# Patient Record
Sex: Female | Born: 2000 | Race: White | Hispanic: No | Marital: Single | Smoking: Never smoker
Health system: Southern US, Community
[De-identification: ages and names within clinical notes are randomized; demographics above are authoritative.]

## PROBLEM LIST (undated history)

## (undated) DIAGNOSIS — R9431 Abnormal electrocardiogram [ECG] [EKG]: Secondary | ICD-10-CM

## (undated) DIAGNOSIS — R000 Tachycardia, unspecified: Secondary | ICD-10-CM

## (undated) DIAGNOSIS — J029 Acute pharyngitis, unspecified: Secondary | ICD-10-CM

---
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
## 2020-01-09 ENCOUNTER — Other Ambulatory Visit: Payer: Self-pay | Admitting: Family Medicine

## 2020-01-14 ENCOUNTER — Telehealth: Payer: Self-pay | Admitting: Family Medicine

## 2020-01-14 NOTE — Telephone Encounter (Signed)
Patient's mother called today to schedule visit for sore throat and clamminess.  Patient's mother was advised to take patient to an Urgent care for eval.  Advised that if strep test is needed that could be done while at [facility] and they can also look into her throat. Mom did not state if she was going to take patient to urgent care.  Spoke with Artelia Laroche and she was going to reach out to patient for further questions

## 2020-01-14 NOTE — Telephone Encounter (Addendum)
I spoke with pt and she said she does not have a fever but she is feeling clammy (not diabetic) and she has felt that way before but cannot remember when or circumstance. Pt said she did not feel like she was going to pass out;no swelling in throat and no difficulty breathing. Pt said the throat is not scratchy it is really sore to swallow. I advised that there are no appts at [facility] today and that I did not think a virtual appt would be as helpful as a face to face visit since pt may need strep test. Pts mom spoke up and said why can pt not come in office and I explained why and she voiced understanding and I also advised that if pt has strep throat and not treated could cause other health issues. Again pts mom who was on speaker phone with pt and voiced understanding. I was not given definite confirmation that pt would go to UC but pts mom thanked me and hung up.

## 2020-01-14 NOTE — Telephone Encounter (Signed)
Agree with advisement to seek face to face care for strep test Thanks

## 2020-01-15 DIAGNOSIS — R0789 Other chest pain: Secondary | ICD-10-CM | POA: Diagnosis not present

## 2020-01-15 DIAGNOSIS — R05 Cough: Secondary | ICD-10-CM | POA: Diagnosis not present

## 2020-01-15 DIAGNOSIS — J029 Acute pharyngitis, unspecified: Secondary | ICD-10-CM | POA: Diagnosis not present

## 2020-01-15 DIAGNOSIS — J069 Acute upper respiratory infection, unspecified: Secondary | ICD-10-CM | POA: Diagnosis not present

## 2020-01-15 DIAGNOSIS — Z20822 Contact with and (suspected) exposure to covid-19: Secondary | ICD-10-CM | POA: Diagnosis not present

## 2020-02-10 ENCOUNTER — Ambulatory Visit (INDEPENDENT_AMBULATORY_CARE_PROVIDER_SITE_OTHER): Payer: BC Managed Care – PPO | Admitting: Family Medicine

## 2020-02-10 ENCOUNTER — Encounter: Payer: Self-pay | Admitting: Family Medicine

## 2020-02-10 ENCOUNTER — Other Ambulatory Visit: Payer: Self-pay

## 2020-02-10 VITALS — BP 102/70 | HR 84 | Temp 97.6°F | Ht 60.33 in | Wt 102.0 lb

## 2020-02-10 DIAGNOSIS — Z0000 Encounter for general adult medical examination without abnormal findings: Secondary | ICD-10-CM | POA: Diagnosis not present

## 2020-02-10 NOTE — Progress Notes (Signed)
Subjective:    Patient ID: Angelina Sheriff, female    DOB: [birth year], 19 y.o.   MRN: [id number]  This visit occurred during the SARS-CoV-2 public health emergency.  Safety protocols were in place, including screening questions prior to the visit, additional usage of staff PPE, and extensive cleaning of exam room while observing appropriate contact time as indicated for disinfecting solutions.    HPI Here for health maintenance exam and to review chronic medical problems    Wt Readings from Last 3 Encounters:  02/10/20 102 lb (46.3 kg) (6 %, Z= -1.60)*  11/23/19 107 lb 8 oz (48.8 kg) (13 %, Z= -1.13)*  01/14/19 104 lb 12 oz (47.5 kg) (11 %, Z= -1.23)*   * Growth percentiles are based on CDC (Girls, 2-20 Years) data.   19.70 kg/m (25 %, Z= -0.68, Source: CDC (Girls, 2-20 Years))  She is eating well - even with long hours  Work is a lot of exercise  She does a [employer] every week   In college this year  Only 2 in person classes-mostly on line  It was a struggle with internet  Grades were good  Will have an apartment next semester   At home now  Working at [employer] shot 10/20 covid status - had moderna vaccine (was sick after 2nd one)  Tdap 9/14   Hep C screen/ HIV screen  Not interested in std screen  Has not been sexually active    Menstrual hx -not heavy or painful   Very regular  Contraception - larissa OC-wants to stick with this  Self breast exam - no lumps or changes   HPV status -not interested in vaccine   Mood -great even with stress    BP Readings from Last 3 Encounters:  02/10/20 102/70  11/23/19 106/64  01/14/19 90/60   Pulse Readings from Last 3 Encounters:  02/10/20 84  11/23/19 87  01/14/19 84   Plans on cheerleading next year  Has a form for school   No vision or hearing problems   Hearing Screening   125Hz  250Hz  500Hz  1000Hz  2000Hz  3000Hz  4000Hz  6000Hz  8000Hz   Right ear:           Left ear:             Visual  Acuity Screening   Right eye Left eye Both eyes  Without correction: 20 30 20 25 20 25   With correction:      She has eye exam upcoming as well   Patient Active Problem List   Diagnosis Date Noted  . General counseling and advice on female contraception 10/14/2018  . Seborrheic dermatitis of scalp 06/02/2018  . Routine general medical examination at a health care facility 03/12/2014  . Routine sports physical exam 04/20/2013  . Mild asthma 04/27/2011  . Allergic rhinitis    Past Medical History:  Diagnosis Date  . Allergic rhinitis   . Asthma    History reviewed. No pertinent surgical history. Social History   Tobacco Use  . Smoking status: Never Smoker  . Smokeless tobacco: Never Used  Substance Use Topics  . Alcohol use: No    Alcohol/week: 0.0 standard drinks  . Drug use: No   Family History  Problem Relation Age of Onset  . Asthma Maternal Grandmother   . Cancer Maternal Grandfather   . COPD Maternal Grandfather   . Stroke Maternal Grandfather    Allergies  Allergen Reactions  . Bee Venom  Anaphylaxis  . Penicillins Hives and Other (See Comments)    Has patient had a PCN reaction causing immediate rash, facial/tongue/throat swelling, SOB or lightheadedness with hypotension: No Has patient had a PCN reaction causing severe rash involving mucus membranes or skin necrosis: No Has patient had a PCN reaction that required hospitalization No Has patient had a PCN reaction occurring within the last 10 years: Yes If all of the above answers are "NO", then may proceed with Cephalosporin use.   Current Outpatient Medications on File Prior to Visit  Medication Sig Dispense Refill  . albuterol (VENTOLIN HFA) 108 (90 Base) MCG/ACT inhaler Inhale 1-2 puffs into the lungs every 4 (four) hours as needed for wheezing or shortness of breath. 1 Inhaler 5  . cetirizine (ZYRTEC) 10 MG tablet TAKE 1 TABLET BY MOUTH EVERY DAY AS NEEDED FOR ALLERGY 30 tablet 11  . EPINEPHrine (EPIPEN  2-PAK) 0.3 mg/0.3 mL IJ SOAJ injection Inject 0.3 mg into the muscle once as needed (for severe allergic reaction).    Marland Kitchen LARISSIA 0.1-20 MG-MCG tablet TAKE 1 TABLET BY MOUTH EVERY DAY 84 tablet 0   No current facility-administered medications on file prior to visit.     Review of Systems  Constitutional: Negative for activity change, appetite change, fatigue, fever and unexpected weight change.  HENT: Negative for congestion, ear pain, rhinorrhea, sinus pressure and sore throat.   Eyes: Negative for pain, redness and visual disturbance.  Respiratory: Negative for cough, shortness of breath and wheezing.   Cardiovascular: Negative for chest pain and palpitations.  Gastrointestinal: Negative for abdominal pain, blood in stool, constipation and diarrhea.  Endocrine: Negative for polydipsia and polyuria.  Genitourinary: Negative for dysuria, frequency and urgency.  Musculoskeletal: Negative for arthralgias, back pain and myalgias.  Skin: Negative for pallor and rash.  Allergic/Immunologic: Negative for environmental allergies.  Neurological: Negative for dizziness, syncope and headaches.  Hematological: Negative for adenopathy. Does not bruise/bleed easily.  Psychiatric/Behavioral: Negative for decreased concentration and dysphoric mood. The patient is not nervous/anxious.        Objective:   Physical Exam Constitutional:      General: She is not in acute distress.    Appearance: Normal appearance. She is well-developed and normal weight. She is not ill-appearing or diaphoretic.  HENT:     Head: Normocephalic and atraumatic.     Right Ear: Tympanic membrane, ear canal and external ear normal.     Left Ear: Tympanic membrane, ear canal and external ear normal.     Nose: Nose normal. No congestion.     Mouth/Throat:     Mouth: Mucous membranes are moist.     Pharynx: Oropharynx is clear. No posterior oropharyngeal erythema.  Eyes:     General: No scleral icterus.    Extraocular  Movements: Extraocular movements intact.     Conjunctiva/sclera: Conjunctivae normal.     Pupils: Pupils are equal, round, and reactive to light.  Neck:     Thyroid: No thyromegaly.     Vascular: No carotid bruit or JVD.  Cardiovascular:     Rate and Rhythm: Normal rate and regular rhythm.     Pulses: Normal pulses.     Heart sounds: Normal heart sounds. No gallop.   Pulmonary:     Effort: Pulmonary effort is normal. No respiratory distress.     Breath sounds: Normal breath sounds. No wheezing.     Comments: Good air exch Chest:     Chest wall: No tenderness.  Abdominal:  General: Bowel sounds are normal. There is no distension or abdominal bruit.     Palpations: Abdomen is soft. There is no mass.     Tenderness: There is no abdominal tenderness.     Hernia: No hernia is present.  Genitourinary:    Comments: Breast exam: No mass, nodules, thickening, tenderness, bulging, retraction, inflamation, nipple discharge or skin changes noted.  No axillary or clavicular LA.     Musculoskeletal:        General: No tenderness. Normal range of motion.     Cervical back: Normal range of motion and neck supple. No rigidity. No muscular tenderness.     Right lower leg: No edema.     Left lower leg: No edema.     Comments: No scoliosis  Nl rom/flexability of joints  Lymphadenopathy:     Cervical: No cervical adenopathy.  Skin:    General: Skin is warm and dry.     Coloration: Skin is not pale.     Findings: No erythema or rash.     Comments: Solar lentigines diffusely Olive complexion  Neurological:     Mental Status: She is alert. Mental status is at baseline.     Cranial Nerves: No cranial nerve deficit.     Motor: No abnormal muscle tone.     Coordination: Coordination normal.     Gait: Gait normal.     Deep Tendon Reflexes: Reflexes are normal and symmetric. Reflexes normal.  Psychiatric:        Mood and Affect: Mood normal.     Comments: Pleasant and talkative              Assessment & Plan:   Problem List Items Addressed This Visit      Other   Routine general medical examination at a health care facility - Primary    Reviewed health habits including diet and exercise and skin cancer prevention Reviewed appropriate screening tests for age  Also reviewed health mt list, fam hx and immunization status , as well as social and family history   See HPI imm utd except HPV-she declines so far  Is covid immunized  Declines std screening (not sexually active)  Using OC Larissa-doing very well  Good exercise and diet habits -encouraged more protein calories to prevent wt loss  Disc use of sun protection  No new fam hx  Form filled out for college cheerleading-no restrictions

## 2020-02-10 NOTE — Patient Instructions (Signed)
Take care of yourself  Eat regular meals  Stay fit  Wear sunscreen   Let us know when you are ready to get HPV vaccines   No restrictions for athletics

## 2020-02-10 NOTE — Assessment & Plan Note (Addendum)
Reviewed health habits including diet and exercise and skin cancer prevention Reviewed appropriate screening tests for age  Also reviewed health mt list, fam hx and immunization status , as well as social and family history   See HPI imm utd except HPV-she declines so far  Is covid immunized  Declines std screening (not sexually active)  Using OC Larissa-doing very well  Good exercise and diet habits -encouraged more protein calories to prevent wt loss  Disc use of sun protection  No new fam hx  Form filled out for college cheerleading-no restrictions

## 2020-03-24 DIAGNOSIS — Z03818 Encounter for observation for suspected exposure to other biological agents ruled out: Secondary | ICD-10-CM | POA: Diagnosis not present

## 2020-03-27 ENCOUNTER — Other Ambulatory Visit: Payer: Self-pay | Admitting: Family Medicine

## 2020-07-03 IMAGING — DX DG HAND COMPLETE 3+V*L*
3 series · 3 of 3 positions shown · non-contrast
Comparison: None.

CLINICAL DATA: Left hand pain after weight lifting. Initial
encounter.

EXAM:
LEFT HAND - COMPLETE 3+ VIEW

[hand ap]
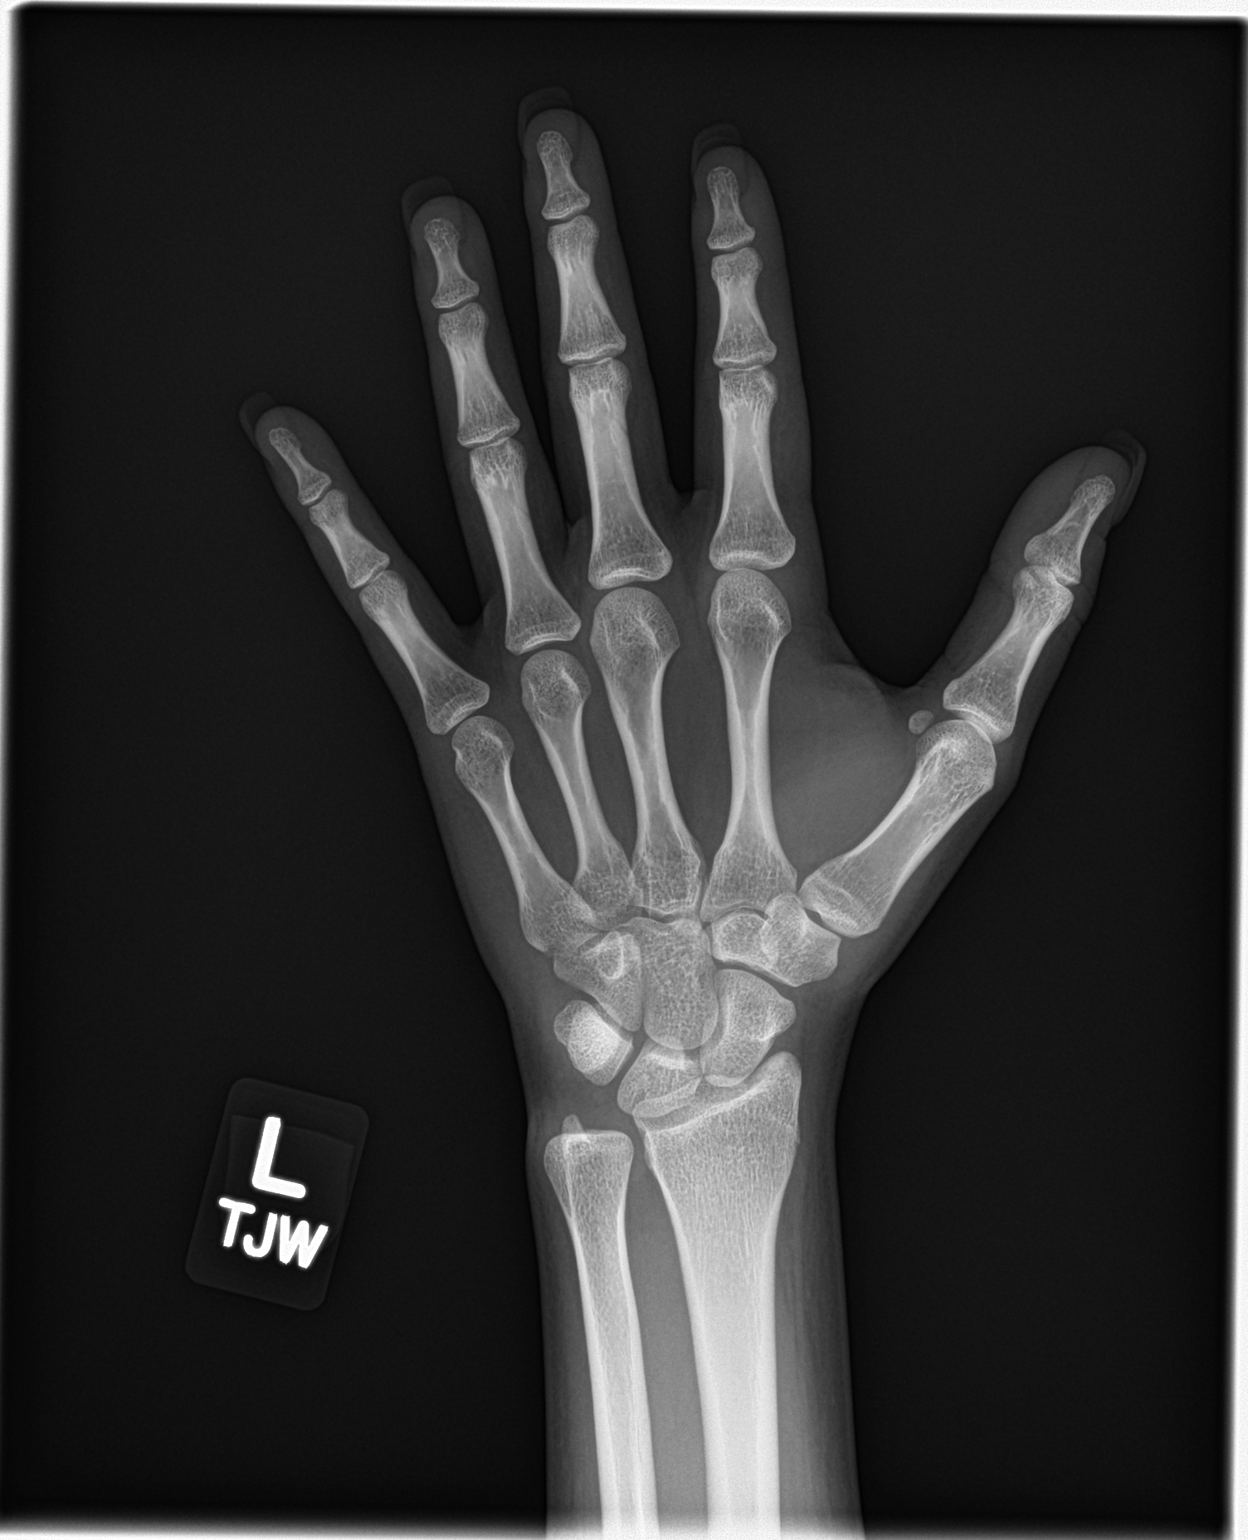

[hand obl]
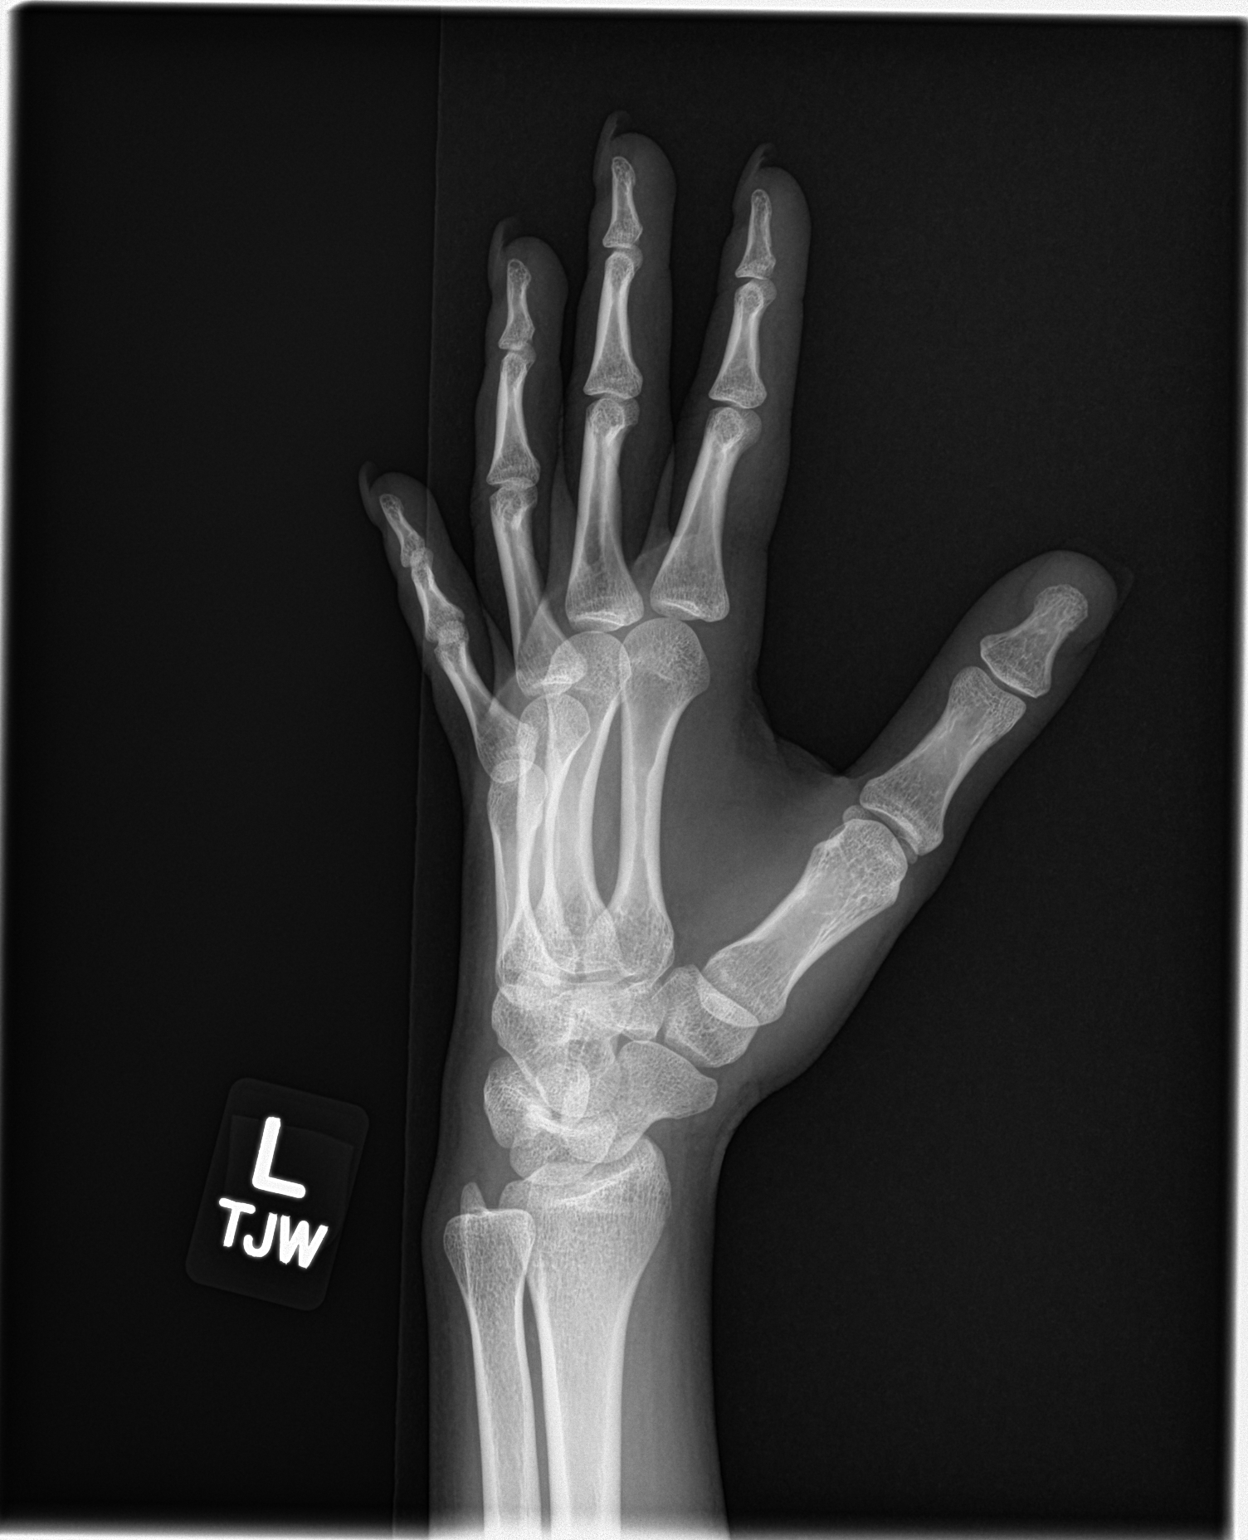

[hand lat]
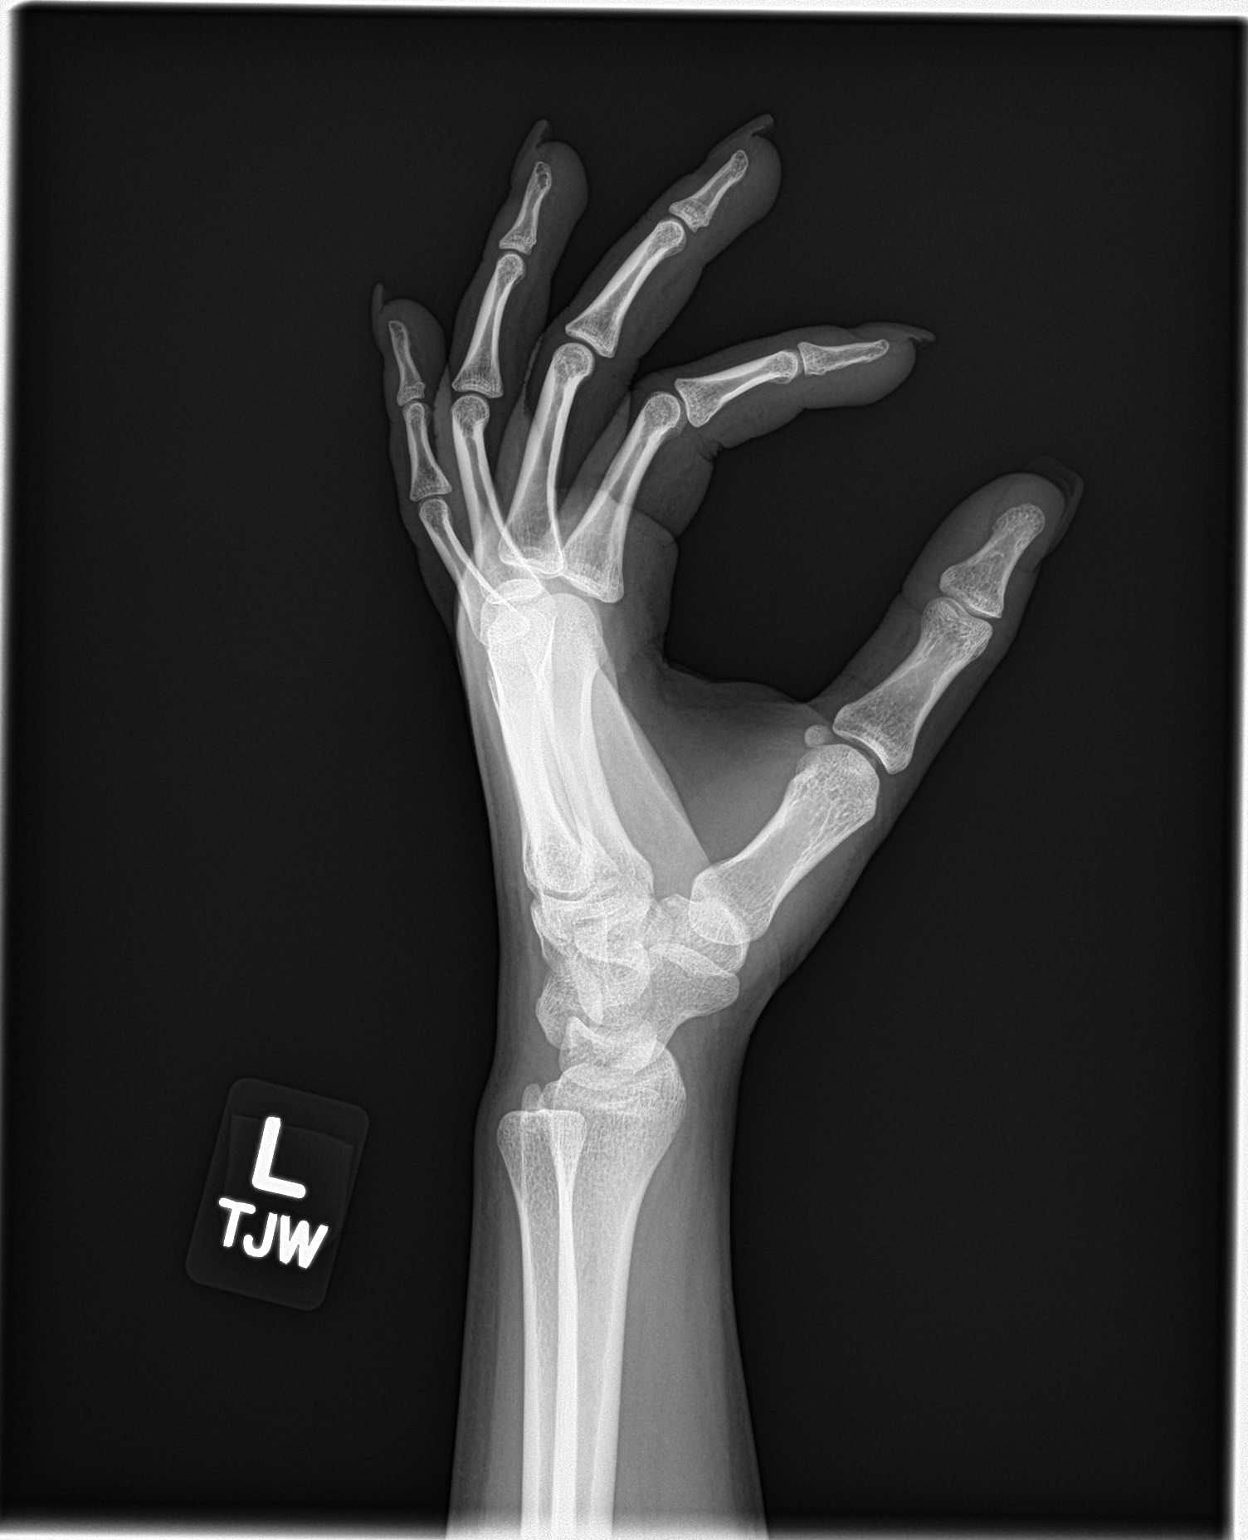

[3 of 3 positions shown; findings below may reference images not displayed]

FINDINGS: There is no evidence of fracture or dislocation. There is no
evidence of arthropathy or other focal bone abnormality. Soft
tissues are unremarkable.
IMPRESSION: Negative.

## 2020-08-16 DIAGNOSIS — U071 COVID-19: Secondary | ICD-10-CM | POA: Diagnosis not present

## 2020-08-16 DIAGNOSIS — Z20828 Contact with and (suspected) exposure to other viral communicable diseases: Secondary | ICD-10-CM | POA: Diagnosis not present

## 2020-08-18 ENCOUNTER — Telehealth: Payer: Self-pay | Admitting: Family Medicine

## 2020-08-18 NOTE — Telephone Encounter (Signed)
Patient is currently under quarantine until 08/23/20 patient is a athlete in college and in order for her to return to school she has to have a athletic physical to return. Please advise. Can she come in or not due to covid? EM

## 2020-08-18 NOTE — Telephone Encounter (Signed)
Please see message and advise 

## 2020-08-18 NOTE — Telephone Encounter (Signed)
LMTCB x1 for pt's mother.

## 2020-08-18 NOTE — Telephone Encounter (Signed)
Cannot see her in office until done with quarantine (and also symptoms must be resolved)

## 2020-08-22 NOTE — Telephone Encounter (Signed)
appt has been scheduled for 06/26/21 (after quarantine time)

## 2020-08-26 ENCOUNTER — Other Ambulatory Visit: Payer: Self-pay

## 2020-08-26 ENCOUNTER — Ambulatory Visit: Payer: BC Managed Care – PPO | Admitting: Family Medicine

## 2020-08-26 ENCOUNTER — Encounter: Payer: Self-pay | Admitting: Family Medicine

## 2020-08-26 VITALS — BP 110/66 | HR 97 | Temp 97.9°F | Ht 60.0 in | Wt 108.2 lb

## 2020-08-26 DIAGNOSIS — Z3009 Encounter for other general counseling and advice on contraception: Secondary | ICD-10-CM

## 2020-08-26 DIAGNOSIS — Z87820 Personal history of traumatic brain injury: Secondary | ICD-10-CM | POA: Diagnosis not present

## 2020-08-26 DIAGNOSIS — Z025 Encounter for examination for participation in sport: Secondary | ICD-10-CM

## 2020-08-26 DIAGNOSIS — Z8616 Personal history of COVID-19: Secondary | ICD-10-CM

## 2020-08-26 NOTE — Patient Instructions (Signed)
Take care of yourself  No restrictions for cheer  Drink lots of water and try to get regular sleep Eat regularly and make sure to stretch to avoid injury  Get a covid booster when you can
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
## 2020-08-28 NOTE — Assessment & Plan Note (Signed)
Reviewed pt's personal and family hx  Had a concussion this year- fully resolved (was followed closely)  Disc athletic preparedness and safety as well as imp of regular meals and fluids  No restrictions for cheer  Forms filled out  Doing well overall s/o mild case of covid and plans to get booster

## 2020-08-28 NOTE — Assessment & Plan Note (Signed)
Doing well with larissa

## 2021-02-04 ENCOUNTER — Other Ambulatory Visit: Payer: Self-pay | Admitting: Family Medicine

## 2021-02-23 DIAGNOSIS — L298 Other pruritus: Secondary | ICD-10-CM | POA: Diagnosis not present

## 2021-02-28 ENCOUNTER — Other Ambulatory Visit: Payer: Self-pay | Admitting: Family Medicine

## 2021-03-01 DIAGNOSIS — Z03818 Encounter for observation for suspected exposure to other biological agents ruled out: Secondary | ICD-10-CM | POA: Diagnosis not present

## 2021-03-01 DIAGNOSIS — J209 Acute bronchitis, unspecified: Secondary | ICD-10-CM | POA: Diagnosis not present

## 2021-03-01 DIAGNOSIS — B9689 Other specified bacterial agents as the cause of diseases classified elsewhere: Secondary | ICD-10-CM | POA: Diagnosis not present

## 2021-03-01 DIAGNOSIS — J019 Acute sinusitis, unspecified: Secondary | ICD-10-CM | POA: Diagnosis not present

## 2021-03-13 ENCOUNTER — Encounter: Payer: Self-pay | Admitting: Emergency Medicine

## 2021-03-13 ENCOUNTER — Other Ambulatory Visit: Payer: Self-pay

## 2021-03-13 ENCOUNTER — Ambulatory Visit
Admission: EM | Admit: 2021-03-13 | Discharge: 2021-03-13 | Disposition: A | Discharge status: Home / Self Care | Payer: BC Managed Care – PPO | Attending: Emergency Medicine | Admitting: Emergency Medicine

## 2021-03-13 ENCOUNTER — Ambulatory Visit (INDEPENDENT_AMBULATORY_CARE_PROVIDER_SITE_OTHER): Payer: BC Managed Care – PPO

## 2021-03-13 ENCOUNTER — Ambulatory Visit: Payer: BC Managed Care – PPO

## 2021-03-13 DIAGNOSIS — R059 Cough, unspecified: Secondary | ICD-10-CM

## 2021-03-13 DIAGNOSIS — R062 Wheezing: Secondary | ICD-10-CM | POA: Diagnosis not present

## 2021-03-13 DIAGNOSIS — R058 Other specified cough: Secondary | ICD-10-CM | POA: Diagnosis not present

## 2021-03-13 NOTE — ED Provider Notes (Signed)
CHIEF COMPLAINT:   Chief Complaint  Patient presents with   Cough   Nasal Congestion     SUBJECTIVE/HPI:   Cough A very pleasant 20 y.o.Female presents today with cough and nasal congestion for the last 2 weeks.  Patient reports a clear/yellow mucus production.  Patient reports that her symptoms are worse at night.  Patient reports some shortness of breath with coughing episodes.  She reports the use of a humidifier at home which has not seemed to help.  She also reports using some cough syrup and hydroxyzine without relief.  Patient states that "otherwise I feel fine".  Patient reports having been tested for COVID-19, influenza, strep on 03/01/2021 which were all negative.  Patient reports being prescribed prednisone, azithromycin, Phenergan cough syrup on 03/01/2021. Patient does not report any chest pain, palpitations, visual changes, weakness, tingling, headache, nausea, vomiting, diarrhea, fever, chills.   has a past medical history of Allergic rhinitis and Asthma.  ROS:  Review of Systems  Respiratory:  Positive for cough.   See Subjective/HPI Medications, Allergies and Problem List personally reviewed in Epic today OBJECTIVE:   Vitals:   03/13/21 1030  BP: 108/74  Pulse: (!) 107  Resp: 16  Temp: 97.9 F (36.6 C)  SpO2: 96%    Physical Exam   General: Appears well-developed and well-nourished. No acute distress.  HEENT Head: Normocephalic and atraumatic.   Ears: Hearing grossly intact, no drainage or visible deformity.  Eyes: Conjunctivae and EOM are normal. No eye drainage or scleral icterus bilaterally.  Neck: Normal range of motion, neck is supple.  Cardiovascular: Normal rate. Regular rhythm; no murmurs, gallops, or rubs.  Pulm/Chest: No respiratory distress. Breath sounds normal bilaterally without wheezes, rhonchi, or rales.  Intermittent cough noted. Neurological: Alert and oriented to person, place, and time.  Skin: Skin is warm and dry.  No rashes, lesions,  abrasions or bruising noted to skin.   Psychiatric: Normal mood, affect, behavior, and thought content.   Vital signs and nursing note reviewed.   Patient stable and cooperative with examination. PROCEDURES:    LABS/X-RAYS/EKG/MEDS:   No results found for any visits on 03/13/21.  MEDICAL DECISION MAKING:   Patient presents with cough and nasal congestion for the last 2 weeks.  Patient reports a clear/yellow mucus production.  Patient reports that her symptoms are worse at night.  Patient reports some shortness of breath with coughing episodes.  She reports the use of a humidifier at home which has not seemed to help.  She also reports using some cough syrup and hydroxyzine without relief.  Patient states that "otherwise I feel fine".  Patient reports having been tested for COVID-19, influenza, strep on 03/01/2021 which were all negative.  Patient reports being prescribed prednisone, azithromycin, Phenergan cough syrup on 03/01/2021. Patient does not report any chest pain, palpitations, visual changes, weakness, tingling, headache, nausea, vomiting, diarrhea, fever, chills.  Chart review completed.  CXR reveals: No pneumonia or pneumothorax.  As read by me, overread pending.  Given symptoms along with assessment findings and recent antibiotic use, no concern for underlying bacterial infection at this time.  Likely cause of continued cough is postviral cough.  Advised the patient that she may continue to have a cough for 3 to 4 weeks due to recent illness.  At this time, I do not feel as if the patient needs an additional steroid pack as she has been prescribed steroids within the last 2 weeks.  Return as needed.  Patient verbalized understanding and  agreed with treatment plan.  Patient stable upon discharge. ASSESSMENT/PLAN:  1. Post-viral cough syndrome Instructions about new medications and side effects provided.  Plan:   Discharge Instructions      For most people this is a self-limiting  process and can take anywhere from 7 - 10 days to start feeling better. A cough can last up to 3-4 weeks. Pay special attention to handwashing.  Always read the labels of cough and cold medications as they may contain some of the ingredients below.  Rest, push lots of fluids (especially water), and utilize supportive care for symptoms. You may take acetaminophen (Tylenol) every 4-6 hours and ibuprofen every 6-8 hours for muscle pain, joint pain, headaches (you may also alternate these medications). Mucinex (guaifenesin) may be taken over the counter for cough as needed can loosen phlegm. Please read the instructions and take as directed.  Sudafed (pseudophedrine) is sold behind the counter and can help reduce nasal pressure; avoid taking this if you have high blood pressure or feel jittery. Sudafed PE (phenylephrine) can be a helpful, short-term, over-the-counter alternative to limit side effects or if you have high blood pressure.  Flonase nasal spray can help alleviate congestion and sinus pressure. Many patients choose Afrin as a nasal decongestant; do not use for more than 3 days for risk of rebound (increased symptoms after stopping medication).  Saline nasal sprays or rinses can also help nasal congestion (use bottled or sterile water). Warm tea with lemon and honey can sooth sore throat and cough, as can cough drops.   Return to clinic for high fever not improving with medications, chest pain, difficulty breathing, non-stop vomiting, or coughing blood. Follow-up with your primary care provider if symptoms do not improve as expected in the next 5-7 days.          Amalia Greenhouse, FNP 03/13/21 1105

## 2021-03-13 NOTE — ED Triage Notes (Signed)
Patient c/o productive cough w/ "clear to yellow" mucus and nasal congestion x 2 weeks.   Patient denies fever at home.   Patient endorses worsening symptoms at night.   Patient endorses having a rapid COVID, flu, and strep test with Negative test results for all test per patient statement.   Patient endorses "when I cough it feels like it's taking my breath".   Patient has taken prescribed cough syrup and Hydroxyzine with no relief of symptoms.

## 2021-03-13 NOTE — Discharge Instructions (Addendum)
For most people this is a self-limiting process and can take anywhere from 7 - 10 days to start feeling better. A cough can last up to 3-4 weeks. Pay special attention to handwashing.  Always read the labels of cough and cold medications as they may contain some of the ingredients below.  Rest, push lots of fluids (especially water), and utilize supportive care for symptoms. You may take acetaminophen (Tylenol) every 4-6 hours and ibuprofen every 6-8 hours for muscle pain, joint pain, headaches (you may also alternate these medications). Mucinex (guaifenesin) may be taken over the counter for cough as needed can loosen phlegm. Please read the instructions and take as directed.  Sudafed (pseudophedrine) is sold behind the counter and can help reduce nasal pressure; avoid taking this if you have high blood pressure or feel jittery. Sudafed PE (phenylephrine) can be a helpful, short-term, over-the-counter alternative to limit side effects or if you have high blood pressure.  Flonase nasal spray can help alleviate congestion and sinus pressure. Many patients choose Afrin as a nasal decongestant; do not use for more than 3 days for risk of rebound (increased symptoms after stopping medication).  Saline nasal sprays or rinses can also help nasal congestion (use bottled or sterile water). Warm tea with lemon and honey can sooth sore throat and cough, as can cough drops.   Return to clinic for high fever not improving with medications, chest pain, difficulty breathing, non-stop vomiting, or coughing blood. Follow-up with your primary care provider if symptoms do not improve as expected in the next 5-7 days.

## 2021-05-29 ENCOUNTER — Other Ambulatory Visit: Payer: Self-pay | Admitting: Family Medicine

## 2021-07-13 ENCOUNTER — Other Ambulatory Visit: Payer: Self-pay | Admitting: Family Medicine

## 2021-07-28 DIAGNOSIS — M222X2 Patellofemoral disorders, left knee: Secondary | ICD-10-CM | POA: Diagnosis not present

## 2021-07-28 DIAGNOSIS — M25562 Pain in left knee: Secondary | ICD-10-CM | POA: Diagnosis not present

## 2021-08-08 ENCOUNTER — Other Ambulatory Visit: Payer: Self-pay | Admitting: Family Medicine

## 2021-08-08 DIAGNOSIS — M25562 Pain in left knee: Secondary | ICD-10-CM

## 2021-08-11 ENCOUNTER — Ambulatory Visit: Payer: BC Managed Care – PPO

## 2021-09-13 DIAGNOSIS — H6123 Impacted cerumen, bilateral: Secondary | ICD-10-CM | POA: Diagnosis not present

## 2021-09-17 DIAGNOSIS — J039 Acute tonsillitis, unspecified: Secondary | ICD-10-CM | POA: Diagnosis not present

## 2021-09-17 DIAGNOSIS — J038 Acute tonsillitis due to other specified organisms: Secondary | ICD-10-CM | POA: Diagnosis not present

## 2021-09-17 DIAGNOSIS — B2790 Infectious mononucleosis, unspecified without complication: Secondary | ICD-10-CM | POA: Diagnosis not present

## 2021-11-02 ENCOUNTER — Other Ambulatory Visit: Payer: Self-pay | Admitting: Family Medicine

## 2021-11-02 MED ORDER — LEVONORGESTREL-ETHINYL ESTRAD 0.1-20 MG-MCG PO TABS: 1.0000 | ORAL_TABLET | Freq: Every day | ORAL | 0 refills | Status: DC

## 2021-11-02 NOTE — Telephone Encounter (Signed)
Scheduled yearly CPE and refilled med  ?

## 2021-12-21 ENCOUNTER — Ambulatory Visit (INDEPENDENT_AMBULATORY_CARE_PROVIDER_SITE_OTHER): Payer: BC Managed Care – PPO | Admitting: Family Medicine

## 2021-12-21 ENCOUNTER — Encounter: Payer: Self-pay | Admitting: Family Medicine

## 2021-12-21 VITALS — BP 110/60 | HR 98 | Temp 98.2°F | Ht 60.5 in | Wt 121.1 lb

## 2021-12-21 DIAGNOSIS — Z0000 Encounter for general adult medical examination without abnormal findings: Secondary | ICD-10-CM | POA: Diagnosis not present

## 2021-12-21 DIAGNOSIS — J301 Allergic rhinitis due to pollen: Secondary | ICD-10-CM | POA: Diagnosis not present

## 2021-12-21 DIAGNOSIS — Z025 Encounter for examination for participation in sport: Secondary | ICD-10-CM | POA: Diagnosis not present

## 2021-12-21 DIAGNOSIS — J4520 Mild intermittent asthma, uncomplicated: Secondary | ICD-10-CM | POA: Diagnosis not present

## 2021-12-21 MED ORDER — LEVONORGESTREL-ETHINYL ESTRAD 0.1-20 MG-MCG PO TABS: 1.0000 | ORAL_TABLET | Freq: Every day | ORAL | 3 refills | Status: DC

## 2021-12-21 NOTE — Assessment & Plan Note (Signed)
Uses prn zyrtec  ?

## 2021-12-21 NOTE — Assessment & Plan Note (Signed)
Reviewed health habits including diet and exercise and skin cancer prevention ?Reviewed appropriate screening tests for age  ?Also reviewed health mt list, fam hx and immunization status , as well as social and family history   ?See HPI ?Reviewed imms /enc flu shot in the fall  ?Has declined hpv vaccine  ?Good health habits ?No smoking  ?No restrictions for sports ?Declines STD screen/is monogamous  ?Nl vision screen  ?Encouraged sunscreen use  ?Continue current OC for contraception  ? ?

## 2021-12-21 NOTE — Progress Notes (Signed)
? ?Subjective:  ? ? Patient ID: Robin Ford, female    DOB: 08-Mar-2001, 21 y.o.   MRN: [id number] ? ?HPI ?Here for health maintenance exam and to review chronic medical problems   ? ?Wt Readings from Last 3 Encounters:  ?12/21/21 121 lb 2 oz (54.9 kg)  ?08/26/20 108 lb 4 oz (49.1 kg) (13 %, Z= -1.15)*  ?02/10/20 102 lb (46.3 kg) (6 %, Z= -1.60)*  ? ?* Growth percentiles are based on CDC (Girls, 2-20 Years) data.  ? ?23.27 kg/m? ? ?Will be a senior in August  ?Doing well  ?Grades are much better now that the pandemic is over  ? ? 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 ?She stopped cheer in January when she had knee problems  ?Plans to go back next year  ?Wants to give it a try   ?Did some physical therapy  ? ?No regular exercise now/ lot of walking  ?Getting a gym membership  ? ?Going on vacation  ?Turning 21  ? ? ? ?Immunization History  ?Administered Date(s) Administered  ? Influenza Inj Mdck Quad Pf 07/29/2020  ? Influenza,inj,Quad PF,6+ Mos 07/23/2017, 06/02/2018, 06/06/2019  ? Influenza-Unspecified 05/14/2015, 05/14/2016  ? Meningococcal Conjugate 03/12/2014  ? Meningococcal Mcv4o 01/12/2019  ? Moderna Sars-Covid-2 Vaccination 10/19/2019, 11/16/2019  ? Tdap 04/20/2013  ? ?Declines HPV imm in the past ? ?Had a flu shot in the fall  ?Had a virus in February (may have been mono) =severe sore throat  ? ? ?Self breast exam : no lumps  ? ?Menses : regular , not heavy or painful , last 5 d  ?Contraception - OC   0.1-20  levonorgestrel-EE  ?Has done well  ? ?Declines STD screening ?Monogamous  ? ?Allergic rhinitis : zyrtec  ? ?No problems with dizziness/heat illness/cp or sob  ?No asthma issues  ?No sickle cell trait  ? ?No recent concussions  ? ? ?PHQ:0  ? ?BP Readings from Last 3 Encounters:  ?12/21/21 110/60  ?03/13/21 108/74  ?08/26/20 110/66  ? ?Pulse Readings from Last 3 Encounters:  ?12/21/21 98  ?03/13/21  (!) 107  ?08/26/20 97  ? ? ? ?Vision Screening  ? Right eye Left eye Both eyes  ?Without correction 20/25 20/25 20/20   ?With correction     ? ? ? ?Patient Active Problem List  ? Diagnosis Date Noted  ? General counseling and advice on female contraception 10/14/2018  ? Seborrheic dermatitis of scalp 06/02/2018  ? Routine general medical examination at a health care facility 03/12/2014  ? Routine sports physical exam 04/20/2013  ? Mild asthma 04/27/2011  ? Allergic rhinitis   ? ?Past Medical History:  ?Diagnosis Date  ? Allergic rhinitis   ? Asthma   ? ?History reviewed. No pertinent surgical history. ?Social History  ? ?Tobacco Use  ? Smoking status: Never  ? Smokeless tobacco: Never  ?Substance Use Topics  ? Alcohol use: No  ?  Alcohol/week: 0.0 standard drinks  ? Drug use: No  ? ?Family History  ?Problem Relation Age of Onset  ? Asthma Maternal Grandmother   ? Cancer Maternal Grandfather   ? COPD Maternal Grandfather   ? Stroke Maternal Grandfather   ? ?Allergies  ?Allergen Reactions  ? Bee Venom Anaphylaxis  ? Penicillins Hives and Other (See Comments)  ?  Has patient had a PCN reaction causing immediate rash, facial/tongue/throat swelling,  SOB or lightheadedness with hypotension: No ?Has patient had a PCN reaction causing severe rash involving mucus membranes or skin necrosis: No ?Has patient had a PCN reaction that required hospitalization No ?Has patient had a PCN reaction occurring within the last 10 years: Yes ?If all of the above answers are "NO", then may proceed with Cephalosporin use.  ? Tessalon [Benzonatate]   ?  rash  ? ?Current Outpatient Medications on File Prior to Visit  ?Medication Sig Dispense Refill  ? cetirizine (ZYRTEC) 10 MG tablet TAKE 1 TABLET BY MOUTH EVERY DAY AS NEEDED FOR ALLERGY 90 tablet 1  ? EPINEPHrine 0.3 mg/0.3 mL IJ SOAJ injection Inject 0.3 mg into the muscle once as needed (for severe allergic reaction).    ? ?No current facility-administered medications on file prior to  visit.  ?  ? ? ? ?Review of Systems  ?Constitutional:  Negative for activity change, appetite change, fatigue, fever and unexpected weight change.  ?HENT:  Negative for congestion, ear pain, rhinorrhea, sinus pressure and sore throat.   ?Eyes:  Negative for pain, redness and visual disturbance.  ?Respiratory:  Negative for cough, shortness of breath and wheezing.   ?Cardiovascular:  Negative for chest pain and palpitations.  ?Gastrointestinal:  Negative for abdominal pain, blood in stool, constipation and diarrhea.  ?Endocrine: Negative for polydipsia and polyuria.  ?Genitourinary:  Negative for dysuria, frequency and urgency.  ?Musculoskeletal:  Negative for arthralgias, back pain and myalgias.  ?Skin:  Negative for pallor and rash.  ?Allergic/Immunologic: Negative for environmental allergies.  ?Neurological:  Negative for dizziness, syncope and headaches.  ?Hematological:  Negative for adenopathy. Does not bruise/bleed easily.  ?Psychiatric/Behavioral:  Negative for decreased concentration and dysphoric mood. The patient is not nervous/anxious.   ? ?   ?Objective:  ? Physical Exam ?Constitutional:   ?   General: She is not in acute distress. ?   Appearance: Normal appearance. She is well-developed and normal weight. She is not ill-appearing or diaphoretic.  ?HENT:  ?   Head: Normocephalic and atraumatic.  ?   Right Ear: Tympanic membrane, ear canal and external ear normal.  ?   Left Ear: Tympanic membrane, ear canal and external ear normal.  ?   Nose: Nose normal. No congestion.  ?   Mouth/Throat:  ?   Mouth: Mucous membranes are moist.  ?   Pharynx: Oropharynx is clear. No posterior oropharyngeal erythema.  ?Eyes:  ?   General: No scleral icterus. ?   Extraocular Movements: Extraocular movements intact.  ?   Conjunctiva/sclera: Conjunctivae normal.  ?   Pupils: Pupils are equal, round, and reactive to light.  ?Neck:  ?   Thyroid: No thyromegaly.  ?   Vascular: No carotid bruit or JVD.  ?Cardiovascular:  ?   Rate  and Rhythm: Normal rate and regular rhythm.  ?   Pulses: Normal pulses.  ?   Heart sounds: Normal heart sounds.  ?  No gallop.  ?Pulmonary:  ?   Effort: Pulmonary effort is normal. No respiratory distress.  ?   Breath sounds: Normal breath sounds. No wheezing.  ?   Comments: Good air exch ?Chest:  ?   Chest wall: No tenderness.  ?Abdominal:  ?   General: Bowel sounds are normal. There is no distension or abdominal bruit.  ?   Palpations: Abdomen is soft. There is no mass.  ?   Tenderness: There is no abdominal tenderness.  ?   Hernia: No hernia is present.  ?Musculoskeletal:     ?  General: No tenderness. Normal range of motion.  ?   Cervical back: Normal range of motion and neck supple. No rigidity. No muscular tenderness.  ?   Right lower leg: No edema.  ?   Left lower leg: No edema.  ?   Comments: No kyphosis  ? ?No scoliosis  ?Nl rom all joints incl knees   ?Lymphadenopathy:  ?   Cervical: No cervical adenopathy.  ?Skin: ?   General: Skin is warm and dry.  ?   Coloration: Skin is not pale.  ?   Findings: No erythema or rash.  ?   Comments: Tanned/olive complexion  ?Few tags   ?Neurological:  ?   Mental Status: She is alert. Mental status is at baseline.  ?   Cranial Nerves: No cranial nerve deficit.  ?   Motor: No abnormal muscle tone.  ?   Coordination: Coordination normal.  ?   Gait: Gait normal.  ?   Deep Tendon Reflexes: Reflexes are normal and symmetric. Reflexes normal.  ?Psychiatric:     ?   Mood and Affect: Mood normal.     ?   Cognition and Memory: Cognition and memory normal.  ?   Comments: Pleasant  ?Cheerful   ? ? ? ? ? ?   ?Assessment & Plan:  ? ?Problem List Items Addressed This Visit   ? ?  ? Respiratory  ? Allergic rhinitis  ?  Uses prn zyrtec  ? ?  ?  ? Mild asthma  ?  Not problematic lately  ?No issues with sports  ?Has not needed albuterol  ? ?  ?  ?  ? Other  ? Routine general medical examination at a health care facility - Primary  ?  Reviewed health habits including diet and exercise and  skin cancer prevention ?Reviewed appropriate screening tests for age  ?Also reviewed health mt list, fam hx and immunization status , as well as social and family history   ?See HPI ?Reviewed imms /enc flu sh

## 2021-12-21 NOTE — Assessment & Plan Note (Signed)
Not problematic lately  ?No issues with sports  ?Has not needed albuterol  ?

## 2021-12-21 NOTE — Assessment & Plan Note (Signed)
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 ?

## 2021-12-21 NOTE — Patient Instructions (Addendum)
You can make an appt with Dr Patsy Lager this summer to consider orthotics for shoes  ? ?Use bike or exercise bike for knees this summer  ? ?Continue the same oral contraceptive  ?Start wearing sunscreen  ? ?No restrictions for sports  ? ? ? ?

## 2022-03-06 ENCOUNTER — Telehealth: Payer: Self-pay | Admitting: Family Medicine

## 2022-03-06 NOTE — Telephone Encounter (Signed)
Patient mother called and said that patient had physical form filled out in May and her daughter misplaced it and they wanted to know if we had a copy they could pick up. Please call back at [phone]

## 2022-03-06 NOTE — Telephone Encounter (Signed)
Patient called back and she was advised that copy of the form will be left up front for her to pick up  Patient stated that she will come by the office and complete a DPR to add her mom.

## 2022-03-06 NOTE — Telephone Encounter (Signed)
Left message on voicemail for patient to call the office back. Need to confirm which form. Unable to find DPR for patient's mom.

## 2022-06-03 ENCOUNTER — Other Ambulatory Visit: Payer: Self-pay

## 2022-06-03 ENCOUNTER — Encounter (HOSPITAL_BASED_OUTPATIENT_CLINIC_OR_DEPARTMENT_OTHER): Payer: Self-pay | Admitting: Emergency Medicine

## 2022-06-03 ENCOUNTER — Emergency Department (HOSPITAL_BASED_OUTPATIENT_CLINIC_OR_DEPARTMENT_OTHER)
Admission: EM | Admit: 2022-06-03 | Discharge: 2022-06-04 | Disposition: A | Discharge status: Home / Self Care | Payer: BC Managed Care – PPO | Attending: Emergency Medicine | Admitting: Emergency Medicine

## 2022-06-03 DIAGNOSIS — Z1152 Encounter for screening for COVID-19: Secondary | ICD-10-CM | POA: Diagnosis not present

## 2022-06-03 DIAGNOSIS — D72829 Elevated white blood cell count, unspecified: Secondary | ICD-10-CM | POA: Insufficient documentation

## 2022-06-03 DIAGNOSIS — J101 Influenza due to other identified influenza virus with other respiratory manifestations: Secondary | ICD-10-CM | POA: Diagnosis not present

## 2022-06-03 DIAGNOSIS — J45909 Unspecified asthma, uncomplicated: Secondary | ICD-10-CM | POA: Diagnosis not present

## 2022-06-03 DIAGNOSIS — M549 Dorsalgia, unspecified: Secondary | ICD-10-CM | POA: Insufficient documentation

## 2022-06-03 DIAGNOSIS — R42 Dizziness and giddiness: Secondary | ICD-10-CM | POA: Insufficient documentation

## 2022-06-03 DIAGNOSIS — R6889 Other general symptoms and signs: Secondary | ICD-10-CM

## 2022-06-03 DIAGNOSIS — R000 Tachycardia, unspecified: Secondary | ICD-10-CM | POA: Diagnosis not present

## 2022-06-03 DIAGNOSIS — J029 Acute pharyngitis, unspecified: Secondary | ICD-10-CM | POA: Diagnosis not present

## 2022-06-03 HISTORY — DX: Abnormal electrocardiogram (ECG) (EKG): R94.31

## 2022-06-03 HISTORY — DX: Tachycardia, unspecified: R00.0

## 2022-06-03 HISTORY — DX: Acute pharyngitis, unspecified: J02.9

## 2022-06-03 LAB — CBC
HCT: 43 % (ref 36.0–46.0)
Hemoglobin: 14.6 g/dL (ref 12.0–15.0)
MCH: 29 pg (ref 26.0–34.0)
MCHC: 34 g/dL (ref 30.0–36.0)
MCV: 85.3 fL (ref 80.0–100.0)
Platelets: 190 10*3/uL (ref 150–400)
RBC: 5.04 MIL/uL (ref 3.87–5.11)
RDW: 13.3 % (ref 11.5–15.5)
WBC: 15.6 10*3/uL — ABNORMAL HIGH (ref 4.0–10.5)
nRBC: 0 % (ref 0.0–0.2)

## 2022-06-03 LAB — URINALYSIS, ROUTINE W REFLEX MICROSCOPIC
Bilirubin Urine: NEGATIVE
Glucose, UA: NEGATIVE mg/dL
Hgb urine dipstick: NEGATIVE
Ketones, ur: >80 mg/dL — AB
Nitrite: NEGATIVE
Specific Gravity, Urine: 1.028 (ref 1.005–1.030)
pH: 5.5 (ref 5.0–8.0)

## 2022-06-03 LAB — LIPASE, BLOOD: Lipase: 26 U/L (ref 11–51)

## 2022-06-03 LAB — COMPREHENSIVE METABOLIC PANEL
ALT: 11 U/L (ref 0–44)
AST: 14 U/L — ABNORMAL LOW (ref 15–41)
Albumin: 4.8 g/dL (ref 3.5–5.0)
Alkaline Phosphatase: 55 U/L (ref 38–126)
Anion gap: 9 (ref 5–15)
BUN: 10 mg/dL (ref 6–20)
CO2: 24 mmol/L (ref 22–32)
Calcium: 9.6 mg/dL (ref 8.9–10.3)
Chloride: 102 mmol/L (ref 98–111)
Creatinine, Ser: 0.77 mg/dL (ref 0.44–1.00)
GFR, Estimated: >60 mL/min (ref >60)
Glucose, Bld: 114 mg/dL — ABNORMAL HIGH (ref 70–99)
Potassium: 3.5 mmol/L (ref 3.5–5.1)
Sodium: 135 mmol/L (ref 135–145)
Total Bilirubin: 0.8 mg/dL (ref 0.3–1.2)
Total Protein: 7.5 g/dL (ref 6.5–8.1)

## 2022-06-03 LAB — PREGNANCY, URINE: Preg Test, Ur: NEGATIVE

## 2022-06-03 NOTE — ED Triage Notes (Signed)
Patient reports waking up with sore throat, chill, achy,  Nausea no vomiting, diarrhea. Some dizziness and headache Back pain and some chest pain

## 2022-06-04 ENCOUNTER — Emergency Department (HOSPITAL_BASED_OUTPATIENT_CLINIC_OR_DEPARTMENT_OTHER): Payer: BC Managed Care – PPO

## 2022-06-04 ENCOUNTER — Encounter: Payer: Self-pay | Admitting: Interventional Cardiology

## 2022-06-04 ENCOUNTER — Ambulatory Visit (INDEPENDENT_AMBULATORY_CARE_PROVIDER_SITE_OTHER): Payer: BC Managed Care – PPO | Admitting: Interventional Cardiology

## 2022-06-04 ENCOUNTER — Encounter (HOSPITAL_BASED_OUTPATIENT_CLINIC_OR_DEPARTMENT_OTHER): Payer: Self-pay | Admitting: Radiology

## 2022-06-04 ENCOUNTER — Encounter: Payer: Self-pay | Admitting: *Deleted

## 2022-06-04 VITALS — BP 94/64 | HR 120 | Ht 61.0 in | Wt 133.0 lb

## 2022-06-04 DIAGNOSIS — R7989 Other specified abnormal findings of blood chemistry: Secondary | ICD-10-CM | POA: Diagnosis not present

## 2022-06-04 DIAGNOSIS — R000 Tachycardia, unspecified: Secondary | ICD-10-CM | POA: Diagnosis not present

## 2022-06-04 DIAGNOSIS — R079 Chest pain, unspecified: Secondary | ICD-10-CM | POA: Diagnosis not present

## 2022-06-04 DIAGNOSIS — I479 Paroxysmal tachycardia, unspecified: Secondary | ICD-10-CM

## 2022-06-04 DIAGNOSIS — R42 Dizziness and giddiness: Secondary | ICD-10-CM | POA: Diagnosis not present

## 2022-06-04 LAB — RESP PANEL BY RT-PCR (FLU A&B, COVID) ARPGX2
Influenza A by PCR: NEGATIVE
Influenza B by PCR: NEGATIVE
SARS Coronavirus 2 by RT PCR: NEGATIVE

## 2022-06-04 LAB — GROUP A STREP BY PCR: Group A Strep by PCR: NOT DETECTED

## 2022-06-04 LAB — D-DIMER, QUANTITATIVE: D-Dimer, Quant: 0.51 ug/mL-FEU — ABNORMAL HIGH (ref 0.00–0.50)

## 2022-06-04 LAB — MONONUCLEOSIS SCREEN: Mono Screen: NEGATIVE

## 2022-06-04 MED ORDER — CEPHALEXIN 500 MG PO CAPS: 500.0000 mg | ORAL_CAPSULE | Freq: Three times a day (TID) | ORAL | 0 refills | Status: DC

## 2022-06-04 MED ORDER — ACETAMINOPHEN 500 MG PO TABS
1000.0000 mg | ORAL_TABLET | Freq: Once | ORAL | Status: AC
  Administered 2022-06-04: 1000 mg via ORAL
  Filled 2022-06-04: qty 2

## 2022-06-04 MED ORDER — SODIUM CHLORIDE 0.9 % IV BOLUS
1000.0000 mL | Freq: Once | INTRAVENOUS | Status: AC
  Administered 2022-06-04: 1000 mL via INTRAVENOUS

## 2022-06-04 MED ORDER — IOHEXOL 350 MG/ML SOLN
100.0000 mL | Freq: Once | INTRAVENOUS | Status: AC | PRN
  Administered 2022-06-04: 60 mL via INTRAVENOUS

## 2022-06-04 NOTE — ED Provider Notes (Signed)
[facility] EMERGENCY DEPT Provider Note   CSN: [id number] Arrival date & time: 06/03/22  2143     History  Chief Complaint  Patient presents with   Sore Throat    JASON HAUGE is a 21 y.o. female.  HPI     This is a 21 year old female who presents with flulike symptoms including sore throat, body aches, chills, nausea.  She also reports some dizziness, back and chest pain.  Onset of symptoms today.  She mostly came in because she noted that her watch indicated that she was tachycardic.  She has not had any fevers at home.  No known sick contacts.  She states that she had some sore throat.  No cough.  Denies dysuria or hematuria.  Reports history of tachycardia that was related to caffeine use.  She denies alcohol and drug use.  She denies any dietary supplements or stimulant use.  Home Medications Prior to Admission medications   Medication Sig Start Date End Date Taking? Authorizing Provider  cephALEXin (KEFLEX) 500 MG capsule Take 1 capsule (500 mg total) by mouth 3 (three) times daily. 06/04/22  Yes Wakisha Alberts, Mayer Masker, MD  cetirizine (ZYRTEC) 10 MG tablet TAKE 1 TABLET BY MOUTH EVERY DAY AS NEEDED FOR ALLERGY 02/06/21   Tower, Idamae Schuller A, MD  EPINEPHrine 0.3 mg/0.3 mL IJ SOAJ injection Inject 0.3 mg into the muscle once as needed (for severe allergic reaction).    [provider]  levonorgestrel-ethinyl estradiol (SRONYX) 0.1-20 MG-MCG tablet Take 1 tablet by mouth daily. 12/21/21   Tower, Audrie Gallus, MD      Allergies    Bee venom, Penicillins, and Tessalon [benzonatate]    Review of Systems   Review of Systems  Constitutional:  Positive for chills. Negative for fever.  HENT:  Positive for sore throat.   Respiratory:  Negative for shortness of breath.   Cardiovascular:  Positive for chest pain.  Gastrointestinal:  Negative for abdominal pain, nausea and vomiting.  Genitourinary:  Negative for dysuria.  All other systems reviewed and are  negative.   Physical Exam Updated Vital Signs BP 93/61   Pulse 97   Temp 98.4 F (36.9 C) (Oral)   Resp 18   LMP 05/21/2022 Comment: neg preg test  SpO2 99%  Physical Exam Vitals and nursing note reviewed.  Constitutional:      Appearance: She is well-developed. She is not ill-appearing.  HENT:     Head: Normocephalic and atraumatic.     Nose: No congestion.     Mouth/Throat:     Mouth: Mucous membranes are moist.     Comments: Posterior oropharynx erythematous, no tonsillar exudate, uvula midline Eyes:     Pupils: Pupils are equal, round, and reactive to light.  Cardiovascular:     Rate and Rhythm: Regular rhythm. Tachycardia present.     Heart sounds: Normal heart sounds.  Pulmonary:     Effort: Pulmonary effort is normal. No respiratory distress.     Breath sounds: No wheezing.  Abdominal:     General: Bowel sounds are normal.     Palpations: Abdomen is soft.     Tenderness: There is no abdominal tenderness.     Comments: No CVA tenderness  Musculoskeletal:     Cervical back: Neck supple.  Skin:    General: Skin is warm and dry.  Neurological:     Mental Status: She is alert and oriented to person, place, and time.  Psychiatric:  Mood and Affect: Mood normal.     ED Results / Procedures / Treatments   Labs (all labs ordered are listed, but only abnormal results are displayed) Labs Reviewed  COMPREHENSIVE METABOLIC PANEL - Abnormal; Notable for the following components:      Result Value   Glucose, Bld 114 (*)    AST 14 (*)    All other components within normal limits  CBC - Abnormal; Notable for the following components:   WBC 15.6 (*)    All other components within normal limits  URINALYSIS, ROUTINE W REFLEX MICROSCOPIC - Abnormal; Notable for the following components:   APPearance HAZY (*)    Ketones, ur >80 (*)    Protein, ur TRACE (*)    Leukocytes,Ua MODERATE (*)    Bacteria, UA FEW (*)    All other components within normal limits   D-DIMER, QUANTITATIVE - Abnormal; Notable for the following components:   D-Dimer, Quant 0.51 (*)    All other components within normal limits  GROUP A STREP BY PCR  RESP PANEL BY RT-PCR (FLU A&B, COVID) [id number]  URINE CULTURE  LIPASE, BLOOD  PREGNANCY, URINE    EKG EKG Interpretation  Date/Time:  Sunday June 03 2022 22:04:00 EDT Ventricular Rate:  134 PR Interval:  154 QRS Duration: 64 QT Interval:  276 QTC Calculation: 412 R Axis:   99 Text Interpretation: Sinus tachycardia Rightward axis Anteroseptal infarct , age undetermined Abnormal ECG When compared with ECG of 20-Jan-2016 18:09, PREVIOUS ECG IS PRESENT Confirmed by Thayer Jew [phone]) on 06/04/2022 1:01:37 AM  Radiology CT Angio Chest PE W and/or Wo Contrast  Result Date: 06/04/2022 CLINICAL DATA:  Pulmonary embolus suspected. Positive D-dimer. Woke up with sore throat, aches, chills, and nausea. Dizziness and headache. EXAM: CT ANGIOGRAPHY CHEST WITH CONTRAST TECHNIQUE: Multidetector CT imaging of the chest was performed using the standard protocol during bolus administration of intravenous contrast. Multiplanar CT image reconstructions and MIPs were obtained to evaluate the vascular anatomy. RADIATION DOSE REDUCTION: This exam was performed according to the departmental dose-optimization program which includes automated exposure control, adjustment of the mA and/or kV according to patient size and/or use of iterative reconstruction technique. CONTRAST:  75mL OMNIPAQUE IOHEXOL 350 MG/ML SOLN COMPARISON:  Chest radiograph 06/04/2022 and 03/13/2021 FINDINGS: Cardiovascular: Good opacification of the central and segmental pulmonary arteries. No focal filling defects. No evidence of significant pulmonary embolus. Normal heart size. No pericardial effusions. Normal caliber thoracic aorta. No aortic dissection. Great vessel origins are patent. Mediastinum/Nodes: Esophagus is decompressed. No significant lymphadenopathy. Thyroid  gland is unremarkable. Increased density in the anterior mediastinum likely representing residual thymic tissue. Lungs/Pleura: Lungs are clear. No pleural effusions. No pneumothorax. Upper Abdomen: No acute abnormalities. Musculoskeletal: No chest wall abnormality. No acute or significant osseous findings. Review of the MIP images confirms the above findings. IMPRESSION: No evidence of significant pulmonary embolus. No evidence of active pulmonary disease. Electronically Signed   By: Lucienne Capers M.D.   On: 06/04/2022 03:18   DG Chest Portable 1 View  Result Date: 06/04/2022 CLINICAL DATA:  Tachycardia and chest pain. EXAM: PORTABLE CHEST 1 VIEW COMPARISON:  03/13/2021 FINDINGS: The heart size and mediastinal contours are within normal limits. Both lungs are clear. The visualized skeletal structures are unremarkable. IMPRESSION: No active disease. Electronically Signed   By: Lucienne Capers M.D.   On: 06/04/2022 02:38    Procedures Procedures    Medications Ordered in ED Medications  acetaminophen (TYLENOL) tablet 1,000 mg (1,000 mg Oral Given  06/04/22 0007)  sodium chloride 0.9 % bolus 1,000 mL (0 mLs Intravenous Stopped 06/04/22 0117)  sodium chloride 0.9 % bolus 1,000 mL (0 mLs Intravenous Stopped 06/04/22 0327)  iohexol (OMNIPAQUE) 350 MG/ML injection 100 mL (60 mLs Intravenous Contrast Given 06/04/22 0254)    ED Course/ Medical Decision Making/ A&P Clinical Course as of 06/04/22 0353  Mon Jun 04, 2022  0349 On recheck, patient states she feels much better.  She states she feels normal and is asymptomatic.  She has persistent tachycardia which is highly variable.  While is in the room it went from low 100s to mid 120s.  She denies any palpitations.  Blood pressure 95 systolic.  States her normal blood pressures are between 100-110 systolic.  She continues to deny any fevers or urinary symptoms.  Urinalysis has some white cells and bacteria.  I did send a urine culture.  She has a prior  history of pyelonephritis with a similar presentation.  She has no CVA tenderness.   Discussed my concern of her vital signs with the patient and her mother.  At this time I do not have a good explanation.  Do feel she needs to see cardiology given her tendency towards tachycardia.  Would hold off on extracurricular activities until that time.  I have offered admission; however, patient would like to be discharged.  We discussed return precautions.  I will provide a prescription for Keflex to cover for possible UTI. [CH]    Clinical Course User Index [CH] Rogue Pautler, Mayer Masker, MD                           Medical Decision Making Amount and/or Complexity of Data Reviewed Labs: ordered. Radiology: ordered.  Risk OTC drugs. Prescription drug management.   This patient presents to the ED for concern of tachycardia, flulike symptoms, this involves an extensive number of treatment options, and is a complaint that carries with it a high risk of complications and morbidity.  I considered the following differential and admission for this acute, potentially life threatening condition.  The differential diagnosis includes viral illness such as COVID or influenza, pneumonia, UTI, strep pharyngitis, mono  MDM:    This is a 21 year old female who presents with flulike symptoms.  Noted to be tachycardic on her watch.  On initial evaluation she is afebrile.  Tachycardic to 140s.  She denies any numbness of breath.  Labs obtained and reviewed.  She has a leukocytosis to 15.  Otherwise, no metabolic derangements.  COVID and influenza are negative.  Strep screening is negative.  Chest x-ray without pneumothorax or pneumonia.  Patient was given 2 L of fluid and Tylenol.  She remained persistently tachycardic out of proportion to her physical exam findings.  For this reason, D-dimer was sent.  It is slightly elevated.  CT PE study was obtained and shows no evidence of blood clot.  Otherwise lung parenchyma is normal.   Patient denies any urinary symptoms.  She does have some leukocytes and bacteria.  I sent a culture given her leukocytosis although clinically she has no other signs or symptoms of pyelonephritis.  On recheck, patient states she feels normal.  She is persistently mildly tachycardic.  Offered the patient admission given persistently abnormal vital signs.  Unclear etiology at this time.  She could have an occult infection.  Her abdominal exam is benign do not feel she needs abdominal imaging at this time.  I do think that  her tachycardia is out of proportion and she would warrant cardiology evaluation.  Patient would like to go home.  She states she feels normal.  I have offered her Keflex to cover for possible UTI.  Mono screening is pending.  I have advised her not to participate in extracurricular activities until she follows up with cardiology.  If she has any new or worsening symptoms she was encouraged to return immediately.  Patient and her mother stated understanding.  (Labs, imaging, consults)  Labs: I Ordered, and personally interpreted labs.  The pertinent results include:  CBC, BMP, COVID, influenza, strep, urinalysis, dimer  Imaging Studies ordered: I ordered imaging studies including chest x-ray, CT PE study I independently visualized and interpreted imaging. I agree with the radiologist interpretation  Additional history obtained from other at bedside.  External records from outside source obtained and reviewed including prior evaluations including hospitalization for pyelonephritis  Cardiac Monitoring: The patient was maintained on a cardiac monitor.  I personally viewed and interpreted the cardiac monitored which showed an underlying rhythm of: Sinus tachycardia  Reevaluation: After the interventions noted above, I reevaluated the patient and found that they have :improved  Social Determinants of Health: Archivist  Disposition: Discharge  Co morbidities that complicate  the patient evaluation  Past Medical History:  Diagnosis Date   Allergic rhinitis    Asthma      Medicines Meds ordered this encounter  Medications   acetaminophen (TYLENOL) tablet 1,000 mg   sodium chloride 0.9 % bolus 1,000 mL   sodium chloride 0.9 % bolus 1,000 mL   iohexol (OMNIPAQUE) 350 MG/ML injection 100 mL   cephALEXin (KEFLEX) 500 MG capsule    Sig: Take 1 capsule (500 mg total) by mouth 3 (three) times daily.    Dispense:  21 capsule    Refill:  0    I have reviewed the patients home medicines and have made adjustments as needed  Problem List / ED Course: Problem List Items Addressed This Visit   None Visit Diagnoses     Flu-like symptoms    -  Primary   Tachycardia       Relevant Orders   Ambulatory referral to Cardiology                   Final Clinical Impression(s) / ED Diagnoses Final diagnoses:  Flu-like symptoms  Tachycardia    Rx / DC Orders ED Discharge Orders          Ordered    cephALEXin (KEFLEX) 500 MG capsule  3 times daily        06/04/22 0352    Ambulatory referral to Cardiology        06/04/22 0352              Shon Baton, MD 06/04/22 0401

## 2022-06-04 NOTE — Patient Instructions (Signed)
Medication Instructions:  Your physician recommends that you continue on your current medications as directed. Please refer to the Current Medication list given to you today.  *If you need a refill on your cardiac medications before your next appointment, please call your pharmacy*   Lab Work: none If you have labs (blood work) drawn today and your tests are completely normal, you will receive your results only by: MyChart Message (if you have MyChart) OR A paper copy in the mail If you have any lab test that is abnormal or we need to change your treatment, we will call you to review the results.   Testing/Procedures: Your physician has requested that you have an echocardiogram. Echocardiography is a painless test that uses sound waves to create images of your heart. It provides your doctor with information about the size and shape of your heart and how well your heart's chambers and valves are working. This procedure takes approximately one hour. There are no restrictions for this procedure. Please do NOT wear cologne, perfume, aftershave, or lotions (deodorant is allowed). Please arrive 15 minutes prior to your appointment time.    Follow-Up: At [facility], you and your health needs are our priority.  As part of our continuing mission to provide you with exceptional heart care, we have created designated Provider Care Teams.  These Care Teams include your primary Cardiologist (physician) and Advanced Practice Providers (APPs -  Physician Assistants and Nurse Practitioners) who all work together to provide you with the care you need, when you need it.  We recommend signing up for the patient portal called "MyChart".  Sign up information is provided on this After Visit Summary.  MyChart is used to connect with patients for Virtual Visits (Telemedicine).  Patients are able to view lab/test results, encounter notes, upcoming appointments, etc.  Non-urgent messages can be sent to your  provider as well.   To learn more about what you can do with MyChart, go to [email or web address].com.    Your next appointment:   As needed  The format for your next appointment:   In Person  Provider:   Jayadeep Varanasi, MD     Other Instructions    Important Information About Sugar       

## 2022-06-04 NOTE — Discharge Instructions (Signed)
You were seen today for flulike symptoms.  Your work-up is largely reassuring.  I did send a urine culture as you have some white cells and bacteria in your urine but it is not clearly a UTI.  You will be given a prescription for Keflex.  You were persistently tachycardic.  Avoid any stimulant medications or caffeine.  Follow-up with cardiology.  If you have any new or worsening symptoms, you should be reevaluated.

## 2022-06-04 NOTE — Progress Notes (Signed)
Cardiology Office Note   Date:  06/04/2022   ID:  SOWMYA PARTRIDGE, DOB [birth year], MRN [id number]  PCP:  Abner Greenspan, MD    No chief complaint on file.  tachycardia  Wt Readings from Last 3 Encounters:  06/04/22 133 lb (60.3 kg)  12/21/21 121 lb 2 oz (54.9 kg)  08/26/20 108 lb 4 oz (49.1 kg) (13 %, Z= -1.15)*   * Growth percentiles are based on CDC (Girls, 2-20 Years) data.       History of Present Illness: Robin Ford is a 21 y.o. female who is being seen today for the evaluation of tachycardia at the request of Tower, Wynelle Fanny, MD.   2016 had e.coli infection.  Had tachycardic episode at that time.    She got a notification of high HR on her watch yesterday.  Mild dizziness.  Resolved with IV fluids in the ER.  Seen in the emergency room yesterday.  Records show: "presents with flulike symptoms including sore throat, body aches, chills, nausea.  She also reports some dizziness, back and chest pain.  Onset of symptoms today.  She mostly came in because she noted that her watch indicated that she was tachycardic.  She has not had any fevers at home.  No known sick contacts.  She states that she had some sore throat.  No cough.  Denies dysuria or hematuria.  Reports history of tachycardia that was related to caffeine use.  She denies alcohol and drug use.  She denies any dietary supplements or stimulant use."  She was referred to cardiology and also prescribed antibiotics.  Monitoring in the emergency room was reported as sinus tachycardia.  ECGs showed sinus tachycardia with heart rate up to 134.  PE protocol CT was done showing: "Good opacification of the central and segmental pulmonary arteries. No focal filling defects. No evidence of significant pulmonary embolus. Normal heart size. No pericardial effusions. Normal caliber thoracic aorta. No aortic dissection. Great vessel origins are patent."  Denies : Chest pain. Leg edema. Nitroglycerin use. Orthopnea. Palpitations.  Paroxysmal nocturnal dyspnea. Shortness of breath. Syncope.   Mild dizziness yesterday.  Improved after IV fluids.   Past Medical History:  Diagnosis Date   Abnormal EKG    Allergic rhinitis    Asthma    Sore throat    Tachycardia     No past surgical history on file.   Current Outpatient Medications  Medication Sig Dispense Refill   cetirizine (ZYRTEC) 10 MG tablet TAKE 1 TABLET BY MOUTH EVERY DAY AS NEEDED FOR ALLERGY 90 tablet 1   levonorgestrel-ethinyl estradiol (SRONYX) 0.1-20 MG-MCG tablet Take 1 tablet by mouth daily. 84 tablet 3   cephALEXin (KEFLEX) 500 MG capsule Take 1 capsule (500 mg total) by mouth 3 (three) times daily. (Patient not taking: Reported on 06/04/2022) 21 capsule 0   EPINEPHrine 0.3 mg/0.3 mL IJ SOAJ injection Inject 0.3 mg into the muscle once as needed (for severe allergic reaction). (Patient not taking: Reported on 06/04/2022)     No current facility-administered medications for this visit.    Allergies:   Bee venom, Penicillins, and Tessalon [benzonatate]    Social History:  The patient  reports that she has never smoked. She has never used smokeless tobacco. She reports that she does not drink alcohol and does not use drugs.   Family History:  The patient's family history includes Asthma in her maternal grandmother; COPD in her maternal grandfather; Cancer in her maternal grandfather; Dementia  in an other family member; Stroke in her maternal grandfather.    ROS:  Please see the history of present illness.   Otherwise, review of systems are positive for improved dizziness.   All other systems are reviewed and negative.    PHYSICAL EXAM: VS:  BP 94/64   Pulse (!) 120   Ht 5\' 1"  (1.549 m)   Wt 133 lb (60.3 kg)   LMP 05/21/2022 Comment: neg preg test  SpO2 98%   BMI 25.13 kg/m  , BMI Body mass index is 25.13 kg/m. GEN: Well nourished, well developed, in no acute distress HEENT: normal Neck: no JVD, carotid bruits, or masses Cardiac: RRR  while lying down, heart rate decreases appropriately, heart rate faster when sitting up-tachycardic at that time; no murmurs, rubs, or gallops,no edema  Respiratory:  clear to auscultation bilaterally, normal work of breathing GI: soft, nontender, nondistended, + BS MS: no deformity or atrophy Skin: warm and dry, no rash Neuro:  Strength and sensation are intact Psych: euthymic mood, full affect   EKG:   The ekg ordered today demonstrates sinus tachycardia   Recent Labs: 06/03/2022: ALT 11; BUN 10; Creatinine, Ser 0.77; Hemoglobin 14.6; Platelets 190; Potassium 3.5; Sodium 135   Lipid Panel No results found for: "CHOL", "TRIG", "HDL", "CHOLHDL", "VLDL", "LDLCALC", "LDLDIRECT"   Other studies Reviewed: Additional studies/ records that were reviewed today with results demonstrating: ER records and labs reviewed.   ASSESSMENT AND PLAN:  Tachycardia: Sinus tachycardia based on the ECG.  Encouraged hydration.  Check echocardiogram to evaluate LV function.  Heart rate is improved today.  No high risk features.  No syncope.  Normal cardiac exam.  Symptoms improved with hydration.  She will work to try to stay better hydrated.  I told her she could liberalize salt intake as well.  No signs of volume overload.  She can resume activity as tolerated.  If she feels like it is too much, she should slow down. Dizziness: Drinks a lot of tea.  She does not drink any energy drinks.  Avoid excessive caffeine.  She potentially could have some mild variant of POTS.  Minimal symptoms so would just treat with hydration at this time.   Check TSH at some point.  There was difficulty drawing blood yesterday.  She will get this checked with her primary care doctor.  No other signs of hyperthyroidism.   Current medicines are reviewed at length with the patient today.  The patient concerns regarding her medicines were addressed.  The following changes have been made:  No change  Labs/ tests ordered today  include:  No orders of the defined types were placed in this encounter.   Recommend 150 minutes/week of aerobic exercise Low fat, low carb, high fiber diet recommended  Disposition:   FU prn   Signed, 06/05/2022, MD  06/04/2022 1:50 PM    [facility] [address] [place], [place], [place]  [place] Phone: [phone]; Fax: [phone]

## 2022-06-05 LAB — URINE CULTURE

## 2022-06-12 ENCOUNTER — Telehealth (INDEPENDENT_AMBULATORY_CARE_PROVIDER_SITE_OTHER): Payer: Self-pay | Admitting: Family Medicine

## 2022-06-12 ENCOUNTER — Encounter: Payer: Self-pay | Admitting: Family Medicine

## 2022-06-12 ENCOUNTER — Ambulatory Visit: Payer: BC Managed Care – PPO

## 2022-06-12 DIAGNOSIS — J069 Acute upper respiratory infection, unspecified: Secondary | ICD-10-CM

## 2022-06-12 NOTE — Assessment & Plan Note (Signed)
Reviewed ER note from 10/22 with neg viral testing  Reviewed hospital records, lab results and studies in detail  Now mainly rhinorrhea and congestion (allergies may play a role also)  Enc her to re start zyrtec 20 mg daily  Also steroid ns of choice otc  ER precautions noted Update if not starting to improve in a week or if worsening   Watch for s/s of bacterial sinusitis

## 2022-06-12 NOTE — Progress Notes (Signed)
Virtual Visit via Video Note  I connected with Robin Ford on 06/12/22 at 12:00 PM EDT by a video enabled telemedicine application and verified that I am speaking with the correct person using two identifiers.  Location: Patient: home Provider: office    I discussed the limitations of evaluation and management by telemedicine and the availability of in person appointments. The patient expressed understanding and agreed to proceed.  Parties involved in encounter  Patient: Robin Ford  Provider:  Loura Pardon MD   Video failed today and visit was done by phone   History of Present Illness: Pt presents for congestion and runny nose  Started 10/22 - had been in ER    A lot of drainage  Cannot get it to go away  Both runny and stuffy nose No facial pain  No headache No ST  No fever or aches or chills  No n/v/d  Ears are not hurting   Cannot sleep   No sneezing   Cough - usually dry but occ prod of green/yellow phlegm  No wheezing  No sob    HR is lower now   Green and clear d/c  No nose bleeds    Otc: Robitussin  Tylenol severe cold and flu  Mucinex  Has not had her zyrtec to take    Had ER visit for flu like symptoms on 10/22 and elevated HR    Reassuring w/u  Neg resp panel, strep, mono , cxr and CTA  Lab Results  Component Value Date   WBC 15.6 (H) 06/03/2022   HGB 14.6 06/03/2022   HCT 43.0 06/03/2022   MCV 85.3 06/03/2022   PLT 190 06/03/2022   Urine cx was contaminated   Lab Results  Component Value Date   CREATININE 0.77 06/03/2022   BUN 10 06/03/2022   NA 135 06/03/2022   K 3.5 06/03/2022   CL 102 06/03/2022   CO2 24 06/03/2022   Lab Results  Component Value Date   ALT 11 06/03/2022   AST 14 (L) 06/03/2022   ALKPHOS 55 06/03/2022   BILITOT 0.8 06/03/2022    Patient Active Problem List   Diagnosis Date Noted   General counseling and advice on female contraception 10/14/2018   Seborrheic dermatitis of scalp 06/02/2018   Routine  general medical examination at a health care facility 03/12/2014   Routine sports physical exam 04/20/2013   Mild asthma 04/27/2011   Allergic rhinitis    Past Medical History:  Diagnosis Date   Abnormal EKG    Allergic rhinitis    Asthma    Sore throat    Tachycardia    History reviewed. No pertinent surgical history. Social History   Tobacco Use   Smoking status: Never   Smokeless tobacco: Never  Substance Use Topics   Alcohol use: No    Alcohol/week: 0.0 standard drinks of alcohol   Drug use: No   Family History  Problem Relation Age of Onset   Asthma Maternal Grandmother    Cancer Maternal Grandfather    COPD Maternal Grandfather    Stroke Maternal Grandfather    Dementia Other    Allergies  Allergen Reactions   Bee Venom Anaphylaxis   Penicillins Hives and Other (See Comments)    Has patient had a PCN reaction causing immediate rash, facial/tongue/throat swelling, SOB or lightheadedness with hypotension: No Has patient had a PCN reaction causing severe rash involving mucus membranes or skin necrosis: No Has patient had a PCN reaction that required  hospitalization No Has patient had a PCN reaction occurring within the last 10 years: Yes If all of the above answers are "NO", then may proceed with Cephalosporin use.   Tessalon [Benzonatate]     rash   Current Outpatient Medications on File Prior to Visit  Medication Sig Dispense Refill   cetirizine (ZYRTEC) 10 MG tablet TAKE 1 TABLET BY MOUTH EVERY DAY AS NEEDED FOR ALLERGY 90 tablet 1   EPINEPHrine 0.3 mg/0.3 mL IJ SOAJ injection Inject 0.3 mg into the muscle once as needed (for severe allergic reaction).     levonorgestrel-ethinyl estradiol (SRONYX) 0.1-20 MG-MCG tablet Take 1 tablet by mouth daily. 84 tablet 3   No current facility-administered medications on file prior to visit.   Review of Systems  Constitutional:  Negative for chills, fever and malaise/fatigue.  HENT:  Positive for congestion. Negative for  ear discharge, ear pain, sinus pain and sore throat.   Eyes:  Negative for blurred vision, discharge and redness.  Respiratory:  Positive for cough and sputum production. Negative for shortness of breath, wheezing and stridor.   Cardiovascular:  Negative for chest pain, palpitations and leg swelling.  Gastrointestinal:  Negative for abdominal pain, diarrhea, nausea and vomiting.  Musculoskeletal:  Negative for myalgias.  Skin:  Negative for rash.  Neurological:  Negative for dizziness and headaches.    Observations/Objective: Pt sounds congested and slt hoarse Not distressed  Good historian/nl cognition  Nl mood   Occ clears throat  Occ dry sounding cough No audible wheeze or sob  Assessment and Plan:  Problem List Items Addressed This Visit       Respiratory   Viral URI with cough    Reviewed ER note from 10/22 with neg viral testing  Reviewed hospital records, lab results and studies in detail  Now mainly rhinorrhea and congestion (allergies may play a role also)  Enc her to re start zyrtec 20 mg daily  Also steroid ns of choice otc  ER precautions noted Update if not starting to improve in a week or if worsening   Watch for s/s of bacterial sinusitis        Follow Up Instructions: Get your zytec 10 mg and take once daily in evening   Also flonase or nasacort nasal spray  Use once daily 2 sprays in each nostril  Continue it for at least a month   Watch for fever, facial pain, worse cough or wheezing    I discussed the assessment and treatment plan with the patient. The patient was provided an opportunity to ask questions and all were answered. The patient agreed with the plan and demonstrated an understanding of the instructions.   The patient was advised to call back or seek an in-person evaluation if the symptoms worsen or if the condition fails to improve as anticipated.  I provided 17 minutes of non-face-to-face time during this encounter.   Loura Pardon,  MD

## 2022-06-12 NOTE — Patient Instructions (Addendum)
Get your zytec 10 mg and take once daily in evening   Also flonase or nasacort nasal spray  Use once daily 2 sprays in each nostril  Continue it for at least a month   Watch for fever, facial pain, worse cough or wheezing

## 2022-06-15 ENCOUNTER — Other Ambulatory Visit (HOSPITAL_COMMUNITY): Payer: BC Managed Care – PPO

## 2022-06-18 ENCOUNTER — Ambulatory Visit: Payer: BC Managed Care – PPO | Attending: Interventional Cardiology

## 2022-06-18 DIAGNOSIS — I479 Paroxysmal tachycardia, unspecified: Secondary | ICD-10-CM | POA: Diagnosis not present

## 2022-06-18 DIAGNOSIS — G8929 Other chronic pain: Secondary | ICD-10-CM

## 2022-06-19 LAB — ECHOCARDIOGRAM COMPLETE
AR max vel: 2.1 cm2
AV Peak grad: 5.8 mmHg
Ao pk vel: 1.2 m/s
Area-P 1/2: 3.74 cm2
Calc EF: 65.6 %
S' Lateral: 2.4 cm
Single Plane A2C EF: 66.3 %
Single Plane A4C EF: 63.7 %

## 2022-07-19 ENCOUNTER — Encounter: Payer: Self-pay | Admitting: Family Medicine

## 2022-07-19 ENCOUNTER — Ambulatory Visit (INDEPENDENT_AMBULATORY_CARE_PROVIDER_SITE_OTHER): Payer: BC Managed Care – PPO | Admitting: Family Medicine

## 2022-07-19 VITALS — BP 106/64 | HR 94 | Temp 98.0°F | Ht 61.0 in | Wt 136.1 lb

## 2022-07-19 DIAGNOSIS — R000 Tachycardia, unspecified: Secondary | ICD-10-CM | POA: Insufficient documentation

## 2022-07-19 DIAGNOSIS — Z23 Encounter for immunization: Secondary | ICD-10-CM | POA: Diagnosis not present

## 2022-07-19 HISTORY — DX: Tachycardia, unspecified: R00.0

## 2022-07-19 LAB — CBC WITH DIFFERENTIAL/PLATELET
Basophils Absolute: 0 10*3/uL (ref 0.0–0.1)
Basophils Relative: 0.7 % (ref 0.0–3.0)
Eosinophils Absolute: 0 10*3/uL (ref 0.0–0.7)
Eosinophils Relative: 1.2 % (ref 0.0–5.0)
HCT: 43 % (ref 36.0–46.0)
Hemoglobin: 14.5 g/dL (ref 12.0–15.0)
Lymphocytes Relative: 31.5 % (ref 12.0–46.0)
Lymphs Abs: 1.2 10*3/uL (ref 0.7–4.0)
MCHC: 33.8 g/dL (ref 30.0–36.0)
MCV: 86.1 fl (ref 78.0–100.0)
Monocytes Absolute: 0.4 10*3/uL (ref 0.1–1.0)
Monocytes Relative: 9.6 % (ref 3.0–12.0)
Neutro Abs: 2.2 10*3/uL (ref 1.4–7.7)
Neutrophils Relative %: 57 % (ref 43.0–77.0)
Platelets: 240 10*3/uL (ref 150.0–400.0)
RBC: 4.99 Mil/uL (ref 3.87–5.11)
RDW: 13.2 % (ref 11.5–15.5)
WBC: 3.9 10*3/uL — ABNORMAL LOW (ref 4.0–10.5)

## 2022-07-19 LAB — TSH: TSH: 0.57 u[IU]/mL (ref 0.35–5.50)
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
## 2022-07-19 NOTE — Patient Instructions (Addendum)
Aim for 64 oz of fluids per day -mostly water   Labs today for thyroid and blood count  Flu shot today   Continue to monitor your pulse and if you feel palpitations or you get worse let u know

## 2022-07-19 NOTE — Assessment & Plan Note (Signed)
Chronic -usually HR in 90s to low 100s  No palpitations  Is physically fit   Saw cardiology /notes and echo rev Reassuring w/u  Not treating   Today cbc and TSH ordered  Nl exam today   Disc poss dehydration, stress reaction or former covid as triggers  Will watch closely for palpitations or dizziness  She continues to monitor with sport watch

## 2022-10-18 DIAGNOSIS — M25561 Pain in right knee: Secondary | ICD-10-CM | POA: Diagnosis not present

## 2022-10-26 DIAGNOSIS — M25561 Pain in right knee: Secondary | ICD-10-CM | POA: Diagnosis not present

## 2022-12-17 ENCOUNTER — Other Ambulatory Visit: Payer: Self-pay | Admitting: Family Medicine

## 2022-12-31 ENCOUNTER — Encounter: Payer: Self-pay | Admitting: Family Medicine

## 2022-12-31 ENCOUNTER — Ambulatory Visit: Payer: BC Managed Care – PPO | Admitting: Family Medicine

## 2022-12-31 VITALS — BP 112/70 | HR 81 | Temp 98.0°F | Ht 61.0 in | Wt 127.2 lb

## 2022-12-31 DIAGNOSIS — Z021 Encounter for pre-employment examination: Secondary | ICD-10-CM

## 2022-12-31 DIAGNOSIS — R000 Tachycardia, unspecified: Secondary | ICD-10-CM

## 2022-12-31 NOTE — Assessment & Plan Note (Signed)
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

## 2022-12-31 NOTE — Assessment & Plan Note (Signed)
This seems to have been post covie/now resolved

## 2022-12-31 NOTE — Patient Instructions (Signed)
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

## 2022-12-31 NOTE — Progress Notes (Signed)
Subjective:    Patient ID: Robin Ford, female    DOB: 09-19-2000, 22 y.o.   MRN: [id number]  HPI Pt presents for pre employment physical  Wt Readings from Last 3 Encounters:  12/31/22 127 lb 4 oz (57.7 kg)  07/19/22 136 lb 2 oz (61.7 kg)  06/04/22 133 lb (60.3 kg)   24.04 kg/m  Today's Vitals   12/31/22 1051  BP: 112/70  Pulse: 81  Temp: 98 F (36.7 C)  TempSrc: Temporal  SpO2: 97%  Weight: 127 lb 4 oz (57.7 kg)  Height: 5\' 1"  (1.549 m)   Body mass index is 24.04 kg/m.  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   Wears contacts for vision / overall not bad  No hearing problems   No restrictions for lifting/carrying   Tries to stay in shape  Walks Also on feet all day long   No lung problems   No HR issues Found out that her tachycardia was possibly from covid Has not come back   Declines HPV vaccine    Tdap was 04/2013  Flu shot in December   Patient Active Problem List   Diagnosis Date Noted   Physical exam, pre-employment 12/31/2022   Tachycardia 07/19/2022   General counseling and advice on female contraception 10/14/2018   Seborrheic dermatitis of scalp 06/02/2018   Routine general medical examination at a health care facility 03/12/2014   Routine sports physical exam 04/20/2013   Mild asthma 04/27/2011   Allergic rhinitis    Past Medical History:  Diagnosis Date   Abnormal EKG    Allergic rhinitis    Asthma    Sore throat    Tachycardia    History reviewed. No pertinent surgical history. Social History   Tobacco Use   Smoking status: Never   Smokeless tobacco: Never  Substance Use Topics   Alcohol use: No    Alcohol/week: 0.0 standard drinks of alcohol   Drug use: No   Family History  Problem Relation Age of Onset   Asthma Maternal Grandmother    Cancer Maternal Grandfather    COPD Maternal Grandfather    Stroke Maternal Grandfather    Dementia Other    Allergies  Allergen Reactions    Bee Venom Anaphylaxis   Penicillins Hives and Other (See Comments)    Has patient had a PCN reaction causing immediate rash, facial/tongue/throat swelling, SOB or lightheadedness with hypotension: No Has patient had a PCN reaction causing severe rash involving mucus membranes or skin necrosis: No Has patient had a PCN reaction that required hospitalization No Has patient had a PCN reaction occurring within the last 10 years: Yes If all of the above answers are "NO", then may proceed with Cephalosporin use.   Tessalon [Benzonatate]     rash   Current Outpatient Medications on File Prior to Visit  Medication Sig Dispense Refill   cetirizine (ZYRTEC) 10 MG tablet TAKE 1 TABLET BY MOUTH EVERY DAY AS NEEDED FOR ALLERGY 90 tablet 1   EPINEPHrine 0.3 mg/0.3 mL IJ SOAJ injection Inject 0.3 mg into the muscle once as needed (for severe allergic reaction).     levonorgestrel-ethinyl estradiol (SRONYX) 0.1-20 MG-MCG tablet TAKE 1 TABLET BY MOUTH EVERY DAY 84 tablet 1   No current facility-administered medications on file prior to visit.    Review of Systems  Constitutional:  Negative for activity change, appetite change, fatigue, fever and unexpected weight change.  HENT:  Negative for congestion, ear pain, rhinorrhea, sinus pressure and sore throat.   Eyes:  Negative for pain, redness and visual disturbance.  Respiratory:  Negative for cough, shortness of breath and wheezing.   Cardiovascular:  Negative for chest pain and palpitations.  Gastrointestinal:  Negative for abdominal pain, blood in stool, constipation and diarrhea.  Endocrine: Negative for polydipsia and polyuria.  Genitourinary:  Negative for dysuria, frequency and urgency.  Musculoskeletal:  Negative for arthralgias, back pain and myalgias.  Skin:  Negative for pallor and rash.  Allergic/Immunologic: Negative for environmental allergies.  Neurological:  Negative for dizziness, syncope and headaches.  Hematological:  Negative for  adenopathy. Does not bruise/bleed easily.  Psychiatric/Behavioral:  Negative for decreased concentration and dysphoric mood. The patient is not nervous/anxious.        Objective:   Physical Exam Constitutional:      General: She is not in acute distress.    Appearance: Normal appearance. She is well-developed and normal weight. She is not ill-appearing or diaphoretic.  HENT:     Head: Normocephalic and atraumatic.  Eyes:     Conjunctiva/sclera: Conjunctivae normal.     Pupils: Pupils are equal, round, and reactive to light.  Neck:     Thyroid: No thyromegaly.     Vascular: No carotid bruit or JVD.  Cardiovascular:     Rate and Rhythm: Normal rate and regular rhythm.     Heart sounds: Normal heart sounds.     No gallop.  Pulmonary:     Effort: Pulmonary effort is normal. No respiratory distress.     Breath sounds: Normal breath sounds. No wheezing or rales.  Abdominal:     General: There is no distension or abdominal bruit.     Palpations: Abdomen is soft.  Musculoskeletal:     Cervical back: Normal range of motion and neck supple.     Right lower leg: No edema.     Left lower leg: No edema.     Comments: Normal rom of joints   Lymphadenopathy:     Cervical: No cervical adenopathy.  Skin:    General: Skin is warm and dry.     Coloration: Skin is not pale.     Findings: No rash.     Comments: Tanned   Neurological:     Mental Status: She is alert.     Motor: No weakness.     Coordination: Coordination normal.     Deep Tendon Reflexes: Reflexes are normal and symmetric. Reflexes normal.  Psychiatric:        Mood and Affect: Mood normal.           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

## 2023-01-08 ENCOUNTER — Telehealth: Payer: Self-pay | Admitting: Family Medicine

## 2023-01-08 NOTE — Telephone Encounter (Signed)
Pt called in need TB test done no availability until 6/11 pt stated she need test done before . Please advise [phone]

## 2023-01-08 NOTE — Telephone Encounter (Signed)
Given we are closed on Friday and I will not be here on 01/14/23 and 01/15/23 (Monday and Tuesday), I will route to Joellen to see if pt can be worked in any sooner. PCP has filled out form for pt she just needs a TB test

## 2023-01-10 NOTE — Telephone Encounter (Signed)
Patient called back has been set up for Monday

## 2023-01-14 ENCOUNTER — Ambulatory Visit (INDEPENDENT_AMBULATORY_CARE_PROVIDER_SITE_OTHER): Payer: PRIVATE HEALTH INSURANCE

## 2023-01-14 DIAGNOSIS — Z111 Encounter for screening for respiratory tuberculosis: Secondary | ICD-10-CM

## 2023-01-14 NOTE — Progress Notes (Signed)
PPD Placement note Angelina Sheriff, 22 y.o. 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 Yes Is patient taking any oral or IV steroid medication now or have they taken it in the last month? no Has the patient ever received the BCG vaccine?: no Has the patient been in recent contact with anyone known or suspected of having active TB disease?: no      Date of exposure (if applicable): n/a      Name of person they were exposed to (if applicable):  Patient's Country of origin?:  O: Alert and oriented in NAD. P:  PPD placed on 01/14/2023.  Patient advised to return for reading within 48-72 hours.

## 2023-01-15 ENCOUNTER — Telehealth: Payer: Self-pay | Admitting: Family Medicine

## 2023-01-15 NOTE — Telephone Encounter (Signed)
Called  patient set up for visit tomorrow.

## 2023-01-15 NOTE — Telephone Encounter (Signed)
Pt called stating she received a TB test yesterday, 6/4. Pt states she needs the test read tomorrow & was told she'd call with the time to come in but didn't. Can you help with this?

## 2023-01-16 ENCOUNTER — Ambulatory Visit (INDEPENDENT_AMBULATORY_CARE_PROVIDER_SITE_OTHER): Payer: PRIVATE HEALTH INSURANCE

## 2023-01-16 DIAGNOSIS — Z23 Encounter for immunization: Secondary | ICD-10-CM

## 2023-01-16 LAB — TB SKIN TEST
Induration: 0 mm
TB Skin Test: NEGATIVE

## 2023-01-16 NOTE — Progress Notes (Signed)
PPD Reading Note  PPD read and results entered in [employer].  Result: 0 mm induration.  Interpretation: negative  If test not read within 48-72 hours of initial placement, patient advised to repeat in other arm 1-3 weeks after this test.  Allergic reaction: no

## 2023-01-16 NOTE — Progress Notes (Signed)
Per orders of Dr. Roxy Manns, injection of tdap given by Lewanda Rife in left deltoid. Patient tolerated injection well.  Pt was given completed health examination certificate.

## 2023-02-12 ENCOUNTER — Ambulatory Visit: Payer: Self-pay

## 2023-03-11 ENCOUNTER — Ambulatory Visit
Admission: EM | Admit: 2023-03-11 | Discharge: 2023-03-11 | Disposition: A | Discharge status: Home / Self Care | Payer: PRIVATE HEALTH INSURANCE

## 2023-03-11 DIAGNOSIS — M545 Low back pain, unspecified: Secondary | ICD-10-CM

## 2023-03-11 NOTE — ED Triage Notes (Signed)
Patient to Urgent Care with complaints of lower back pain. Denies any radiation. Symptoms started yesterday morning. Believes it could be related to an injury- works as a Producer, television/film/video.  [facility] for [facility].

## 2023-03-11 NOTE — Discharge Instructions (Signed)
Recommend use of NSAIDs as well as cold therapy to treat your symptoms.  If your symptoms do not improve, recommend follow-up with evaluation by orthopedic provider.

## 2023-03-11 NOTE — ED Provider Notes (Addendum)
Renaldo Fiddler    CSN: [id number] Arrival date & time: 03/11/23  1058      History   Chief Complaint Chief Complaint  Patient presents with   Back Pain    HPI Robin Ford is a 22 y.o. female.    Back Pain  Presents to urgent care with complaint of lower back pain.  Denies radiculopathy.  Symptoms started yesterday morning.  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  She states [facility] and [facility].  Patient states she was at a "cheer camp" over the weekend and feels she may have injured herself while doing a "catch".  She has been using only Tylenol to relieve her symptoms.  Denies saddle paresthesias.  Past Medical History:  Diagnosis Date   Abnormal EKG    Allergic rhinitis    Asthma    Sore throat    Tachycardia     Patient Active Problem List   Diagnosis Date Noted   Physical exam, pre-employment 12/31/2022   General counseling and advice on female contraception 10/14/2018   Seborrheic dermatitis of scalp 06/02/2018   Routine general medical examination at a health care facility 03/12/2014   Routine sports physical exam 04/20/2013   Allergic rhinitis     History reviewed. No pertinent surgical history.  OB History   No obstetric history on file.      Home Medications    Prior to Admission medications   Medication Sig Start Date End Date Taking? Authorizing Provider  cetirizine (ZYRTEC) 10 MG tablet TAKE 1 TABLET BY MOUTH EVERY DAY AS NEEDED FOR ALLERGY 02/06/21   Tower, Idamae Schuller A, MD  EPINEPHrine 0.3 mg/0.3 mL IJ SOAJ injection Inject 0.3 mg into the muscle once as needed (for severe allergic reaction).    [provider]  levonorgestrel-ethinyl estradiol (SRONYX) 0.1-20 MG-MCG tablet TAKE 1 TABLET BY MOUTH EVERY DAY 12/18/22   Tower, Audrie Gallus, MD    Family History Family History  Problem Relation Age of Onset   Asthma Maternal Grandmother     Cancer Maternal Grandfather    COPD Maternal Grandfather    Stroke Maternal Grandfather    Dementia Other     Social History Social History   Tobacco Use   Smoking status: Never   Smokeless tobacco: Never  Substance Use Topics   Alcohol use: No    Alcohol/week: 0.0 standard drinks of alcohol   Drug use: No     Allergies   Bee venom, Penicillins, and Tessalon [benzonatate]   Review of Systems Review of Systems  Musculoskeletal:  Positive for back pain.     Physical Exam Triage Vital Signs ED Triage Vitals  Encounter Vitals Group     BP 03/11/23 1114 110/75     Systolic BP Percentile --      Diastolic BP Percentile --      Pulse Rate 03/11/23 1114 65     Resp 03/11/23 1114 17     Temp 03/11/23 1114 98.8 F (37.1 C)     Temp src --      SpO2 03/11/23 1114 98 %     Weight --      Height --      Head Circumference --      Peak Flow --      Pain Score 03/11/23 1112 5     Pain Loc --  Pain Education --      Exclude from Growth Chart --    No data found.  Updated Vital Signs BP 110/75   Pulse 65   Temp 98.8 F (37.1 C)   Resp 17   LMP 03/04/2023   SpO2 98%   Visual Acuity Right Eye Distance:   Left Eye Distance:   Bilateral Distance:    Right Eye Near:   Left Eye Near:    Bilateral Near:     Physical Exam Vitals reviewed.  Constitutional:      Appearance: Normal appearance.  Musculoskeletal:     Lumbar back: Tenderness and bony tenderness present. Positive left straight leg raise test.     Comments: Bony tenderness is noted approximately L4-L5.  Predominantly left-sided tenderness at the same level.  Positive left straight leg raise.  Skin:    General: Skin is warm and dry.  Neurological:     General: No focal deficit present.     Mental Status: She is alert and oriented to person, place, and time.  Psychiatric:        Mood and Affect: Mood normal.        Behavior: Behavior normal.      UC Treatments / Results  Labs (all labs  ordered are listed, but only abnormal results are displayed) Labs Reviewed - No data to display  EKG   Radiology No results found.  Procedures Procedures (including critical care time)  Medications Ordered in UC Medications - No data to display  Initial Impression / Assessment and Plan / UC Course  I have reviewed the triage vital signs and the nursing notes.  Pertinent labs & imaging results that were available during my care of the patient were reviewed by me and considered in my medical decision making (see chart for details).   Robin Ford is a 22 y.o. female presenting with acute L sided lumbar back pain. Patient is afebrile without recent antipyretics, satting well on room air. Overall is well appearing, well hydrated, without respiratory distress.  Patient is able to move to a supine position from sitting without difficulty.  She ambulates without difficulty or noticeable gait change.  There is some bony tenderness noted approximately L4-L5.  There is left-sided muscular tenderness at the same level.  Positive straight leg raise on the left side  Reviewed relevant chart history.   Given patient's mechanism of injury, most likely diagnosis is acute muscle strain of the left side lower back.  While I am somewhat concerned about the bony tenderness the patient noted with palpation, given her age, health and lack of medical history and red flag symptoms, I do not feel the mechanism of injury is likely to indicate a skeletal/spinal injury.  Recommending conservative care with intermittent cold therapy and use of NSAIDs.  Patient will follow-up with orthopedics if symptoms do not resolve within a few weeks.  Counseled patient on potential for adverse effects with medications prescribed/recommended today, ER and return-to-clinic precautions discussed, patient verbalized understanding and agreement with care plan.  Final Clinical Impressions(s) / UC Diagnoses   Final diagnoses:  None    Discharge Instructions   None    ED Prescriptions   None    PDMP not reviewed this encounter.   Charma Igo, FNP 03/11/23 1143    Charma Igo, FNP 03/11/23 1145

## 2023-03-15 ENCOUNTER — Telehealth: Payer: Self-pay | Admitting: Family Medicine

## 2023-03-15 MED ORDER — ALBUTEROL SULFATE HFA 108 (90 BASE) MCG/ACT IN AERS: 1.0000 | INHALATION_SPRAY | RESPIRATORY_TRACT | 1 refills | Status: DC | PRN

## 2023-03-15 NOTE — Telephone Encounter (Signed)
I sent it  Uses prn

## 2023-03-15 NOTE — Telephone Encounter (Signed)
Pharmacy

## 2023-03-15 NOTE — Telephone Encounter (Signed)
Albuterol sulfate rescue inhaler  900 mcg  [id number] [place], [place] - [address] Phone: [phone]  Fax: [phone]

## 2023-03-15 NOTE — Telephone Encounter (Signed)
Not on current medication list. Please advise.

## 2023-03-15 NOTE — Telephone Encounter (Signed)
Pt called in requesting a refill on her inhaler did not remember the name of inhaler . Rx is not on med list  . Please advise [phone]

## 2023-03-15 NOTE — Telephone Encounter (Signed)
Robin Ford notified by telephone that inhaler has been sent to her pharmacy.

## 2023-05-29 ENCOUNTER — Other Ambulatory Visit: Payer: Self-pay | Admitting: Family Medicine

## 2023-06-05 ENCOUNTER — Telehealth: Payer: Self-pay | Admitting: Family Medicine

## 2023-06-05 NOTE — Telephone Encounter (Signed)
Aware, please triage and schedule office appointment (in office) if appropriate

## 2023-06-05 NOTE — Telephone Encounter (Signed)
FYI: This call has been transferred to Access Nurse. Once the result note has been entered staff can address the message at that time.  Patient called in with the following symptoms:  Red Word:dizziness  Patient scheduled an appointment through MyChart for lightheadedness and dizziness.   Please advise at [facility] [phone]  Message is routed to Provider Pool and [facility] Triage

## 2023-06-05 NOTE — Telephone Encounter (Signed)
See note below of Dr Clarice Pole to reach pt by phone and left v/m requesting cb [phone].sending note to lsc triage.

## 2023-06-05 NOTE — Telephone Encounter (Addendum)
Unable to reach patient by phone and left v/m requesting call back at [phone]. Sending note to Dr Milinda Antis. Tower pool and [facility] triage.

## 2023-06-06 ENCOUNTER — Ambulatory Visit: Payer: Self-pay | Admitting: Family Medicine

## 2023-06-06 ENCOUNTER — Encounter: Payer: Self-pay | Admitting: Family Medicine

## 2023-06-06 VITALS — BP 108/68 | HR 96 | Temp 98.2°F | Ht 61.0 in | Wt 125.2 lb

## 2023-06-06 DIAGNOSIS — R42 Dizziness and giddiness: Secondary | ICD-10-CM

## 2023-06-06 DIAGNOSIS — I479 Paroxysmal tachycardia, unspecified: Secondary | ICD-10-CM

## 2023-06-06 DIAGNOSIS — Z23 Encounter for immunization: Secondary | ICD-10-CM | POA: Diagnosis not present

## 2023-06-06 NOTE — Patient Instructions (Signed)
Drink more fluids Aim for 64 oz day - mostly water  Avoid excessive caffeine  Be very careful with alcohol   It is ok to have a little extra salty food   Get up more slowly  Never change position very fast   Avoid overheating  When in heat- drink lots of fluids  Don't skip meals Eat regularly   If you are congested from allergies use a steroid nasal spray like flonase or nasonex daily in season   Take care of yourself

## 2023-06-06 NOTE — Assessment & Plan Note (Signed)
Reviewed cardiology notes/EKG from a year ago  No more episodes Has had low blood pressure and dizziness w/o tachycardia   Continue to monitor  She keeps her sports watch on to monitor pulse

## 2023-06-06 NOTE — Assessment & Plan Note (Signed)
This occurred after overheating when dehydrated, and again with getting up too fast Has baseline low blood pressure, also ? Of tachycardia in past from cardiology (possible mild POTS) Today exam is normal  Reviewed last cardiology note with EKG and last labs  Expect that increasing fluids, liberalizing salt and changing positions more slowly will help  No symptoms today  Will try this and reach out if episodes continue or if any new palpitations Encouraged to keep watching pulse with her fitness watch also

## 2023-06-06 NOTE — Progress Notes (Signed)
Subjective:    Patient ID: Robin Ford, female    DOB: [birth year], 22 y.o.   MRN: [id number]  HPI  Wt Readings from Last 3 Encounters:  06/06/23 125 lb 4 oz (56.8 kg)  12/31/22 127 lb 4 oz (57.7 kg)  07/19/22 136 lb 2 oz (61.7 kg)   23.67 kg/m  Vitals:   06/06/23 1150  BP: 108/68  Pulse: 96  Temp: 98.2 F (36.8 C)  SpO2: 100%    Pt presents with c/o dizziness/ sees black spots in her vision   Getting a flu shot as well   Saturday she got really hot  Went to football game (did not drink enough fluids) and then went to a store  Was not drinking alcohol  She got a wave of dizziness/ light headed  Stood still for less than a minute / closed her eyes and then was fine   Lighter episode Monday - when got up fast   Had a mild headache on Saturday  Pressure feeling   Has never happened before   No palpitations No chest pain   Watches her pulse (has been to cardiology for tachycardia and was told to drink more water and also liberalize salt intake)  Reviewed this note from1 0/2023  Discussed possible mild variant of POTS     BP Readings from Last 3 Encounters:  06/06/23 108/68  03/11/23 110/75  12/31/22 112/70   Pulse Readings from Last 3 Encounters:  06/06/23 96  03/11/23 65  12/31/22 81   Lab Results  Component Value Date   WBC 3.9 (L) 07/19/2022   HGB 14.5 07/19/2022   HCT 43.0 07/19/2022   MCV 86.1 07/19/2022   PLT 240.0 07/19/2022   Lab Results  Component Value Date   TSH 0.57 07/19/2022   Lab Results  Component Value Date   NA 135 06/03/2022   K 3.5 06/03/2022   CO2 24 06/03/2022   GLUCOSE 114 (H) 06/03/2022   BUN 10 06/03/2022   CREATININE 0.77 06/03/2022   CALCIUM 9.6 06/03/2022   GFRNONAA >60 06/03/2022    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 No pertinent surgical history. Social History   Tobacco Use   Smoking status: Never   Smokeless tobacco: Never  Substance Use Topics   Alcohol use: No    Alcohol/week: 0.0 standard drinks of alcohol   Drug use: No   Family History  Problem Relation Age of Onset   Asthma Maternal Grandmother    Cancer Maternal Grandfather    COPD Maternal Grandfather    Stroke Maternal Grandfather    Dementia Other    Allergies  Allergen Reactions   Bee Venom Anaphylaxis   Penicillins Hives and Other (See Comments)    Has patient had a PCN reaction causing immediate rash, facial/tongue/throat swelling, SOB or lightheadedness with  hypotension: No Has patient had a PCN reaction causing severe rash involving mucus membranes or skin necrosis: No Has patient had a PCN reaction that required hospitalization No Has patient had a PCN reaction occurring within the last 10 years: Yes If all of the above answers are "NO", then may proceed with Cephalosporin use.   Tessalon [Benzonatate]     rash   Current Outpatient Medications on File Prior to Visit  Medication Sig Dispense Refill   albuterol (VENTOLIN HFA) 108 (90 Base) MCG/ACT inhaler Inhale 1-2 puffs into the lungs every 4 (four) hours as needed for wheezing or shortness of breath. 6.7 each 1   cetirizine (ZYRTEC) 10 MG tablet TAKE 1 TABLET BY MOUTH EVERY DAY AS NEEDED FOR ALLERGY 90 tablet 1   EPINEPHrine 0.3 mg/0.3 mL IJ SOAJ injection Inject 0.3 mg into the muscle once as needed (for severe allergic reaction).     SRONYX 0.1-20  MG-MCG tablet TAKE 1 TABLET BY MOUTH EVERY DAY 84 tablet 1   No current facility-administered medications on file prior to visit.    Review of Systems  Constitutional:  Negative for activity change, appetite change, fatigue, fever and unexpected weight change.  HENT:  Negative for congestion, ear pain, rhinorrhea, sinus pressure and sore throat.   Eyes:  Negative for pain, redness and visual disturbance.  Respiratory:  Negative for cough, shortness of breath and wheezing.   Cardiovascular:  Negative for chest pain and palpitations.  Gastrointestinal:  Negative for abdominal pain, blood in stool, constipation and diarrhea.  Endocrine: Negative for polydipsia and polyuria.  Genitourinary:  Negative for dysuria, frequency and urgency.  Musculoskeletal:  Negative for arthralgias, back pain and myalgias.  Skin:  Negative for pallor and rash.  Allergic/Immunologic: Negative for environmental allergies.  Neurological:  Positive for dizziness, light-headedness and headaches. Negative for tremors, seizures, syncope, facial asymmetry, speech difficulty, weakness and numbness.       Occational headache Possible when dehydrated  Hematological:  Negative for adenopathy. Does not bruise/bleed easily.  Psychiatric/Behavioral:  Negative for decreased concentration and dysphoric mood. The patient is not nervous/anxious.        Objective:   Physical Exam Constitutional:      General: She is not in acute distress.    Appearance: Normal appearance. She is well-developed and normal weight. She is not ill-appearing or diaphoretic.  HENT:     Head: Normocephalic and atraumatic.     Right Ear: Tympanic membrane, ear canal and external ear normal.     Left Ear: Tympanic membrane, ear canal and external ear normal.     Ears:     Comments: Partial cerumen bilat     Nose: Nose normal.     Mouth/Throat:     Mouth: Mucous membranes are moist.     Pharynx: No oropharyngeal exudate.  Eyes:     General: No  scleral icterus.       Right eye: No discharge.        Left eye: No discharge.     Conjunctiva/sclera: Conjunctivae normal.     Pupils: Pupils are equal, round, and reactive to light.     Comments: No nystagmus  Neck:     Thyroid: No thyromegaly.     Vascular: No carotid bruit or JVD.     Trachea: No tracheal deviation.  Cardiovascular:     Rate and Rhythm: Regular rhythm. Tachycardia present.     Heart sounds: Normal heart sounds. No murmur heard.    No friction rub.  Pulmonary:     Effort: Pulmonary effort is normal. No respiratory distress.     Breath sounds: Normal breath sounds. No stridor. No wheezing, rhonchi or rales.  Chest:     Chest wall: No tenderness.  Abdominal:     General: Bowel sounds are normal. There is no distension.     Palpations: Abdomen is soft. There is no mass.     Tenderness: There is no abdominal tenderness.  Musculoskeletal:        General: No tenderness.     Cervical back: Full passive range of motion without pain, normal range of motion and neck supple.  Lymphadenopathy:     Cervical: No cervical adenopathy.  Skin:    General: Skin is warm and dry.     Coloration: Skin is not pale.     Findings: No rash.  Neurological:     Mental Status: She is alert and oriented to person, place, and time.     Cranial Nerves: No cranial nerve deficit, dysarthria or facial asymmetry.     Sensory: Sensation is intact. No sensory deficit.     Motor: No weakness, tremor, atrophy, abnormal muscle tone, seizure activity or pronator drift.     Coordination: Romberg sign negative. Coordination normal. Finger-Nose-Finger Test normal.     Gait: Gait and tandem walk normal.     Deep Tendon Reflexes: Reflexes are normal and symmetric. Reflexes normal.     Comments: No focal cerebellar signs   Psychiatric:        Mood and Affect: Mood normal.        Behavior: Behavior normal.        Thought Content: Thought content normal.           Assessment & Plan:   Problem  List Items Addressed This Visit       Cardiovascular and Mediastinum   Paroxysmal tachycardia [facility])    Reviewed cardiology notes/EKG from a year ago  No more episodes Has had low blood pressure and dizziness w/o tachycardia   Continue to monitor  She keeps her sports watch on to monitor pulse         Other   Episode of dizziness - Primary    This occurred after overheating when dehydrated, and again with getting up too fast Has baseline low blood pressure, also ? Of tachycardia in past from cardiology (possible mild POTS) Today exam is normal  Reviewed last cardiology note with EKG and last labs  Expect that increasing fluids, liberalizing salt and changing positions more slowly will help  No symptoms today  Will try this and reach out if episodes continue or if any new palpitations Encouraged to keep watching pulse with her fitness watch also       Other Visit Diagnoses     Encounter for immunization       Relevant Orders   Flu vaccine trivalent PF, 6mos and older(Flulaval,Afluria,Fluarix,Fluzone) (Completed)

## 2023-06-06 NOTE — Telephone Encounter (Signed)
I spoke with pt and she said she was seen today; was going to ask if anything else needed and before I could ask pt hung up phone. Sending note to Dr Milinda Antis as Lorain Childes.

## 2023-06-11 ENCOUNTER — Ambulatory Visit: Payer: Self-pay | Admitting: Family Medicine

## 2023-07-24 ENCOUNTER — Encounter: Payer: Self-pay | Admitting: Family Medicine

## 2023-07-24 ENCOUNTER — Ambulatory Visit: Payer: PRIVATE HEALTH INSURANCE | Admitting: Family Medicine

## 2023-07-24 VITALS — BP 104/68 | HR 81 | Temp 98.0°F | Ht 61.0 in | Wt 123.5 lb

## 2023-07-24 DIAGNOSIS — J029 Acute pharyngitis, unspecified: Secondary | ICD-10-CM

## 2023-07-24 DIAGNOSIS — J020 Streptococcal pharyngitis: Secondary | ICD-10-CM | POA: Diagnosis not present

## 2023-07-24 LAB — POCT RAPID STREP A (OFFICE): Rapid Strep A Screen: POSITIVE — AB

## 2023-07-24 MED ORDER — AZITHROMYCIN 250 MG PO TABS: ORAL_TABLET | ORAL | 0 refills | Status: AC

## 2023-07-24 NOTE — Progress Notes (Unsigned)
Adrienna Karis T. Najwa Spillane, MD, CAQ Sports Medicine [facility] [address] [place], [address]  Phone: [phone]  FAX: [phone]  Robin Ford - 22 y.o. female  MRN [id number]  Date of Birth: February 21, 2001  Date: 07/24/2023  PCP: Judy Pimple, MD  Referral: Judy Pimple, MD  Chief Complaint  Patient presents with   Sore Throat   Adenopathy   Subjective:   Robin Ford is a 22 y.o. very pleasant female patient with Body mass index is 23.34 kg/m. who presents with the following:  She is a pleasant young patient who I recall from childhood, and she presents with a sore throat and painful right-sided lymphadenopathy and enlargement of the lymph nodes on the right side.  She also has a headache and has some mild ear pain.  She does not have any coughing or runny nose.  She is not a smoker.  She does have a cousin who had strep throat.  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    Review of Systems is noted in the HPI, as appropriate  Objective:   BP 104/68 (BP Location: Left Arm, Patient Position: Sitting, Cuff Size: Normal)   Pulse 81   Temp 98 F (36.7 C) (Temporal)   Ht 5\' 1"  (1.549 m)   Wt 123 lb 8 oz (56 kg)   SpO2 99%   BMI 23.34 kg/m    Gen: WDWN, NAD. Globally Non-toxic HEENT: Throat red in appearance, w/o exudate, R TM clear, L TM - good landmarks, No fluid present. No rhinnorhea.  MMM Frontal sinuses: NT Max sinuses: NT NECK: Anterior cervical  LAD is absent CV: RRR, No M/G/R, cap refill <2 sec PULM: Breathing comfortably in no respiratory distress. no wheezing, crackles, rhonchi   Laboratory and Imaging Data: Results for orders placed or performed in visit on 07/24/23  POCT rapid strep A   Collection Time: 07/24/23  3:45 PM  Result Value Ref Range   Rapid Strep A Screen Positive (A) Negative     Assessment and Plan:     ICD-10-CM   1.  Strep throat  J02.0     2. Sore throat  J02.9 POCT rapid strep A     Strep throat.  Treat as such.  Penicillin allergic.  Medication Management during today's office visit: Meds ordered this encounter  Medications   azithromycin (ZITHROMAX) 250 MG tablet    Sig: Take 2 tablets (500 mg total) by mouth daily for 1 day, THEN 1 tablet (250 mg total) daily for 4 days.    Dispense:  6 tablet    Refill:  0   There are no discontinued medications.  Orders placed today for conditions managed today: Orders Placed This Encounter  Procedures   POCT rapid strep A    Disposition: No follow-ups on file.  [employer] speech-to-text software was used for transcription in this dictation.  Possible transcriptional errors can occur using Animal nutritionist.   Signed,  Elpidio Galea. Shreeya Recendiz, MD   Outpatient Encounter Medications as of 07/24/2023  Medication Sig   albuterol (VENTOLIN HFA) 108 (90 Base) MCG/ACT inhaler Inhale 1-2 puffs into the lungs every 4 (four) hours as needed for wheezing or shortness of breath.   azithromycin (ZITHROMAX) 250 MG tablet Take 2 tablets (500 mg total) by mouth daily for 1 day, THEN 1 tablet (250 mg total) daily for  4 days.   cetirizine (ZYRTEC) 10 MG tablet TAKE 1 TABLET BY MOUTH EVERY DAY AS NEEDED FOR ALLERGY   EPINEPHrine 0.3 mg/0.3 mL IJ SOAJ injection Inject 0.3 mg into the muscle once as needed (for severe allergic reaction).   SRONYX 0.1-20 MG-MCG tablet TAKE 1 TABLET BY MOUTH EVERY DAY   No facility-administered encounter medications on file as of 07/24/2023.

## 2023-07-25 ENCOUNTER — Encounter: Payer: Self-pay | Admitting: Family Medicine

## 2023-08-13 ENCOUNTER — Encounter: Payer: Self-pay | Admitting: Family Medicine

## 2023-08-13 ENCOUNTER — Ambulatory Visit (INDEPENDENT_AMBULATORY_CARE_PROVIDER_SITE_OTHER): Payer: PRIVATE HEALTH INSURANCE | Admitting: Family Medicine

## 2023-08-13 VITALS — BP 96/54 | HR 101 | Temp 98.0°F | Ht 61.0 in | Wt 123.2 lb

## 2023-08-13 DIAGNOSIS — L509 Urticaria, unspecified: Secondary | ICD-10-CM | POA: Diagnosis not present

## 2023-08-13 MED ORDER — EPINEPHRINE 0.3 MG/0.3ML IJ SOAJ: 0.3000 mg | Freq: Once | INTRAMUSCULAR | 1 refills | Status: AC | PRN

## 2023-08-13 NOTE — Patient Instructions (Addendum)
 Take your zyrtec  daily for at least 2 weeks  Stop it for several days before allergist appointment    If needed- benadryl  is ok   Stay cool / ice packs help also    If symptoms worsen let us  know  Keep the allergist appointment   Avoid all nuts  Carry epi pen   If you develop  swelling of lips/mouth/throat or trouble breathing - get to ER    Avoid shaving until rash is in better control  When you do- use a brand new razor blade

## 2023-08-13 NOTE — Assessment & Plan Note (Signed)
 Pt had episode of urticaria on inner thighs and waist line Sunday night after eating several kinds of nuts (unusual for her)  No signs and symptoms of anaphylaxis  Was very itchy and almost resolved with benadryl  Today has one whelp on right inner thigh near knee , some excoriations and some small resolving papules (? Follicles , in area of scratching) Has history of hives in past- with pcn, tessalon  and also bee stings  Refilled her epi pen today Instructed to take zyrtec  10 mg daily  If needed, benadryl  with caution of sedation Keep areas clean  Avoid hot conditions/ hot water Update if not still improving in a week or if worsening  Has appointment with allergist in late January -urged to keep that  Will avoid all nuts in the meantime   Call back and Er precautions noted in detail today

## 2023-08-13 NOTE — Progress Notes (Signed)
 Subjective:    Patient ID: Robin Ford, female    DOB: 07/09/2001, 22 y.o.   MRN: [id number]  HPI  Wt Readings from Last 3 Encounters:  08/13/23 123 lb 4 oz (55.9 kg)  07/24/23 123 lb 8 oz (56 kg)  06/06/23 125 lb 4 oz (56.8 kg)   23.29 kg/m  Vitals:   08/13/23 1037  BP: (!) 96/54  Pulse: (!) 101  Temp: 98 F (36.7 C)  SpO2: 97%   Pt presents for c/o rash on inner thighs , back of legs and back (waist band area)   Started Sunday night  Improved now   Some whelps Some smaller bumps  Very itchy   She ate nuts on Sunday- hazel nuts and peanuts and pecans  Has not had them in a while   No past allergy noted   No change in products  No swimming or hot tub   Over the counter Took benadryl  Sunday night-helped  Not taking zyrtec  lately   No swelling of mouth /tongue or throat or lips  No shortness of breath  No wheezing   Has past history of hives   Has epi pen -is old  Needs refill   Has an allergist appointment in late January    Is moving to [place]  in August for at least 3 years   Patient Active Problem List   Diagnosis Date Noted   Urticaria 08/13/2023   Episode of dizziness 06/06/2023   Paroxysmal tachycardia (HCC) 06/06/2023   Physical exam, pre-employment 12/31/2022   General counseling and advice on female contraception 10/14/2018   Seborrheic dermatitis of scalp 06/02/2018   Routine general medical examination at a health care facility 03/12/2014   Routine sports physical exam 04/20/2013   Allergic rhinitis    Past Medical History:  Diagnosis Date   Abnormal EKG    Allergic rhinitis    Asthma    Sore throat    Tachycardia    History reviewed. No pertinent surgical history. Social History   Tobacco Use   Smoking status: Never   Smokeless tobacco: Never  Substance Use Topics   Alcohol use: No    Alcohol/week: 0.0 standard drinks of alcohol   Drug use: No   Family History  Problem Relation Age of Onset   Asthma Maternal  Grandmother    Cancer Maternal Grandfather    COPD Maternal Grandfather    Stroke Maternal Grandfather    Dementia Other    Allergies  Allergen Reactions   Bee Venom Anaphylaxis   Penicillins Hives and Other (See Comments)    Has patient had a PCN reaction causing immediate rash, facial/tongue/throat swelling, SOB or lightheadedness with hypotension: No Has patient had a PCN reaction causing severe rash involving mucus membranes or skin necrosis: No Has patient had a PCN reaction that required hospitalization No Has patient had a PCN reaction occurring within the last 10 years: Yes If all of the above answers are NO, then may proceed with Cephalosporin use.   Tessalon  [Benzonatate ]     rash   Current Outpatient Medications on File Prior to Visit  Medication Sig Dispense Refill   albuterol  (VENTOLIN  HFA) 108 (90 Base) MCG/ACT inhaler Inhale 1-2 puffs into the lungs every 4 (four) hours as needed for wheezing or shortness of breath. 6.7 each 1   cetirizine  (ZYRTEC ) 10 MG tablet TAKE 1 TABLET BY MOUTH EVERY DAY AS NEEDED FOR ALLERGY 90 tablet 1   SRONYX 0.1-20 MG-MCG tablet TAKE 1 TABLET  BY MOUTH EVERY DAY 84 tablet 1   No current facility-administered medications on file prior to visit.    Review of Systems  Constitutional:  Negative for activity change, appetite change, fatigue, fever and unexpected weight change.  HENT:  Negative for congestion, ear pain, rhinorrhea, sinus pressure and sore throat.   Eyes:  Negative for pain, redness and visual disturbance.  Respiratory:  Negative for cough, shortness of breath, wheezing and stridor.   Cardiovascular:  Negative for chest pain and palpitations.  Gastrointestinal:  Negative for abdominal pain, blood in stool, constipation and diarrhea.  Endocrine: Negative for polydipsia and polyuria.  Genitourinary:  Negative for dysuria, frequency and urgency.  Musculoskeletal:  Negative for arthralgias, back pain and myalgias.  Skin:  Positive  for rash. Negative for pallor and wound.  Allergic/Immunologic: Negative for environmental allergies.  Neurological:  Negative for dizziness, syncope and headaches.  Hematological:  Negative for adenopathy. Does not bruise/bleed easily.  Psychiatric/Behavioral:  Negative for decreased concentration and dysphoric mood. The patient is not nervous/anxious.        Objective:   Physical Exam Constitutional:      General: She is not in acute distress.    Appearance: Normal appearance. She is normal weight. She is not ill-appearing or diaphoretic.  HENT:     Mouth/Throat:     Mouth: Mucous membranes are moist.     Pharynx: Oropharynx is clear. No oropharyngeal exudate or posterior oropharyngeal erythema.     Comments: No mouth/lip/throat swelling  Eyes:     General:        Right eye: No discharge.        Left eye: No discharge.     Conjunctiva/sclera: Conjunctivae normal.     Pupils: Pupils are equal, round, and reactive to light.  Skin:    Comments: Excoriations with some bruising on inner thighs  One 1.5 cm whelp near medial right knee  No rash on waist but some excoriations   Some follicle prominence on legs  No erythema or scabbing    Neurological:     Mental Status: She is alert.           Assessment & Plan:   Problem List Items Addressed This Visit       Musculoskeletal and Integument   Urticaria - Primary   Pt had episode of urticaria on inner thighs and waist line Sunday night after eating several kinds of nuts (unusual for her)  No signs and symptoms of anaphylaxis  Was very itchy and almost resolved with benadryl  Today has one whelp on right inner thigh near knee , some excoriations and some small resolving papules (? Follicles , in area of scratching) Has history of hives in past- with pcn, tessalon  and also bee stings  Refilled her epi pen today Instructed to take zyrtec  10 mg daily  If needed, benadryl  with caution of sedation Keep areas clean  Avoid hot  conditions/ hot water Update if not still improving in a week or if worsening  Has appointment with allergist in late January -urged to keep that  Will avoid all nuts in the meantime   Call back and Er precautions noted in detail today

## 2023-09-24 ENCOUNTER — Encounter: Payer: Self-pay | Admitting: Family Medicine

## 2023-09-24 ENCOUNTER — Ambulatory Visit: Payer: PRIVATE HEALTH INSURANCE | Admitting: Family Medicine

## 2023-09-24 VITALS — BP 104/62 | HR 93 | Temp 98.1°F | Ht 61.0 in | Wt 121.0 lb

## 2023-09-24 DIAGNOSIS — M67441 Ganglion, right hand: Secondary | ICD-10-CM | POA: Diagnosis not present

## 2023-09-24 NOTE — Progress Notes (Signed)
Subjective:    Patient ID: Robin Ford, female    DOB: [birth year], 23 y.o.   MRN: [id number]  HPI  Wt Readings from Last 3 Encounters:  09/24/23 121 lb (54.9 kg)  08/13/23 123 lb 4 oz (55.9 kg)  07/24/23 123 lb 8 oz (56 kg)   22.86 kg/m  Vitals:   09/24/23 1118  BP: 104/62  Pulse: 93  Temp: 98.1 F (36.7 C)  SpO2: 100%    Patient presents for knot on wrist that is sore   Back of right wrist  Sore to the touch and to flex wrist   No trauma that she knows of   Thinks this has come and gone in the past   Is right handed    Patient Active Problem List   Diagnosis Date Noted   Ganglion cyst of tendon sheath of right hand 09/24/2023   Urticaria 08/13/2023   Episode of dizziness 06/06/2023   Paroxysmal tachycardia (HCC) 06/06/2023   Physical exam, pre-employment 12/31/2022   General counseling and advice on female contraception 10/14/2018   Seborrheic dermatitis of scalp 06/02/2018   Routine general medical examination at a health care facility 03/12/2014   Routine sports physical exam 04/20/2013   Allergic rhinitis    Past Medical History:  Diagnosis Date   Abnormal EKG    Allergic rhinitis    Asthma    Sore throat    Tachycardia    History reviewed. No pertinent surgical history. Social History   Tobacco Use   Smoking status: Never   Smokeless tobacco: Never  Substance Use Topics   Alcohol use: No    Alcohol/week: 0.0 standard drinks of alcohol   Drug use: No   Family History  Problem Relation Age of Onset   Asthma Maternal Grandmother    Cancer Maternal Grandfather    COPD Maternal Grandfather    Stroke Maternal Grandfather    Dementia Other    Allergies  Allergen Reactions   Bee Venom Anaphylaxis   Penicillins Hives and Other (See Comments)    Has patient had a PCN reaction causing immediate rash, facial/tongue/throat swelling, SOB or lightheadedness with hypotension: No Has patient had a PCN reaction causing severe rash involving mucus  membranes or skin necrosis: No Has patient had a PCN reaction that required hospitalization No Has patient had a PCN reaction occurring within the last 10 years: Yes If all of the above answers are "NO", then may proceed with Cephalosporin use.   Tessalon [Benzonatate]     rash   Current Outpatient Medications on File Prior to Visit  Medication Sig Dispense Refill   albuterol (VENTOLIN HFA) 108 (90 Base) MCG/ACT inhaler Inhale 1-2 puffs into the lungs every 4 (four) hours as needed for wheezing or shortness of breath. 6.7 each 1   cetirizine (ZYRTEC) 10 MG tablet TAKE 1 TABLET BY MOUTH EVERY DAY AS NEEDED FOR ALLERGY 90 tablet 1   EPINEPHrine 0.3 mg/0.3 mL IJ SOAJ injection Inject 0.3 mg into the muscle once as needed (for severe allergic reaction). 1 each 1   SRONYX 0.1-20 MG-MCG tablet TAKE 1 TABLET BY MOUTH EVERY DAY 84 tablet 1   No current facility-administered medications on file prior to visit.    Review of Systems  Constitutional:  Negative for activity change, appetite change, fatigue, fever and unexpected weight change.  HENT:  Negative for congestion, ear pain, rhinorrhea, sinus pressure and sore throat.   Eyes:  Negative for pain, redness and visual disturbance.  Respiratory:  Negative for cough, shortness of breath and wheezing.   Cardiovascular:  Negative for chest pain and palpitations.  Gastrointestinal:  Negative for abdominal pain, blood in stool, constipation and diarrhea.  Endocrine: Negative for polydipsia and polyuria.  Genitourinary:  Negative for dysuria, frequency and urgency.  Musculoskeletal:  Negative for arthralgias, back pain and myalgias.       Right hand is sore just where bump is   Skin:  Negative for pallor and rash.  Allergic/Immunologic: Negative for environmental allergies.  Neurological:  Negative for dizziness, syncope, weakness, numbness and headaches.  Hematological:  Negative for adenopathy. Does not bruise/bleed easily.   Psychiatric/Behavioral:  Negative for decreased concentration and dysphoric mood. The patient is not nervous/anxious.        Objective:   Physical Exam Constitutional:      General: She is not in acute distress.    Appearance: Normal appearance. She is normal weight. She is not ill-appearing or diaphoretic.  Cardiovascular:     Rate and Rhythm: Normal rate and regular rhythm.  Pulmonary:     Effort: Pulmonary effort is normal.     Breath sounds: Normal breath sounds.  Musculoskeletal:     Comments: 0.5 to 1 cm round rubbery mass over proximal dorsal hand - close to base of 3rd metacarpal  Mildly tender  No redness or skin change Normal rom of wrist and fingers  Full flex of wrist does cause lump to feel sore   Normal grip and perfusion of hand   Skin:    General: Skin is warm and dry.     Coloration: Skin is not jaundiced.     Findings: No bruising, erythema, lesion or rash.  Neurological:     Mental Status: She is alert.     Cranial Nerves: No cranial nerve deficit.     Sensory: No sensory deficit.     Motor: No weakness.  Psychiatric:        Mood and Affect: Mood normal.           Assessment & Plan:   Problem List Items Addressed This Visit       Musculoskeletal and Integument   Ganglion cyst of tendon sheath of right hand - Primary   Lump on dorsal hand resembles ganglion cyst  Some pain with full flex of wrist in right handed pt   Referral made to ortho /hand specialist for eval and treat  Encouraged to avoid trauma to the area  Encouraged to call if suddenly worse/larger or any change in color of skin  Can use a warm compress if bothersome        Relevant Orders   Ambulatory referral to Orthopedic Surgery

## 2023-09-24 NOTE — Assessment & Plan Note (Addendum)
Lump on dorsal hand resembles ganglion cyst  Some pain with full flex of wrist in right handed pt   Referral made to ortho /hand specialist for eval and treat  Encouraged to avoid trauma to the area  Encouraged to call if suddenly worse/larger or any change in color of skin  Can use a warm compress if bothersome

## 2023-09-24 NOTE — Patient Instructions (Signed)
I put the referral in for ortho / hand specialist  Please let us know if you don't hear in 1-2 weeks  to set that up  In the meantime try not to traumatize the area   If symptoms greatly worsen in meantime call us

## 2023-09-27 ENCOUNTER — Encounter: Payer: Self-pay | Admitting: Orthopaedic Surgery

## 2023-09-27 ENCOUNTER — Ambulatory Visit: Payer: PRIVATE HEALTH INSURANCE | Admitting: Orthopaedic Surgery

## 2023-09-27 DIAGNOSIS — R200 Anesthesia of skin: Secondary | ICD-10-CM

## 2023-09-27 DIAGNOSIS — M67431 Ganglion, right wrist: Secondary | ICD-10-CM | POA: Diagnosis not present

## 2023-09-27 NOTE — Progress Notes (Signed)
Office Visit Note   Patient: Robin Ford           Date of Birth: 30-Mar-2001           MRN: [id number] Visit Date: 09/27/2023              Requested by: Tower, Audrie Gallus, MD [address] [place],  [place] [address] PCP: Judy Pimple, MD   Assessment & Plan: Visit Diagnoses:  1. Ganglion cyst of dorsum of right wrist   2. Numbness of left hand     Plan: Robin Ford is a very pleasant 23 year old female with a right dorsal wrist ganglion cyst and left hand dorsal numbness.  In regards to the cyst treatment options were discussed and she would like to think about her options and she will reach out to [place] if she decides she wants to have this surgically excised.  In the meantime she will just watch it.  For the numbness she does not have any focal motor or sensory deficits.  Symptoms are not constant.  I would also recommend just watching it for now and I would expect this to resolve.  Follow-Up Instructions: No follow-ups on file.   Orders:  No orders of the defined types were placed in this encounter.  No orders of the defined types were placed in this encounter.     Procedures: No procedures performed   Clinical Data: No additional findings.   Subjective: Chief Complaint  Patient presents with   Right Hand - Pain    HPI Patient is pleasant 23 year old female here for evaluation of 2 problems.  The first 1 is right dorsal wrist ganglion cyst and the second 1 is numbness to the dorsal aspect of the left hand that comes and goes.  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  Denies any specific injuries.  Right-hand-dominant. Review of Systems  Constitutional: Negative.   HENT: Negative.    Eyes: Negative.   Respiratory: Negative.    Cardiovascular: Negative.   Endocrine: Negative.   Musculoskeletal: Negative.   Neurological: Negative.   Hematological: Negative.   Psychiatric/Behavioral: Negative.    All other systems reviewed and  are negative.    Objective: Vital Signs: LMP 09/03/2023   Physical Exam Vitals and nursing note reviewed.  Constitutional:      Appearance: She is well-developed.  HENT:     Head: Atraumatic.     Nose: Nose normal.  Eyes:     Extraocular Movements: Extraocular movements intact.  Cardiovascular:     Pulses: Normal pulses.  Pulmonary:     Effort: Pulmonary effort is normal.  Abdominal:     Palpations: Abdomen is soft.  Musculoskeletal:     Cervical back: Neck supple.  Skin:    General: Skin is warm.     Capillary Refill: Capillary refill takes less than 2 seconds.  Neurological:     Mental Status: She is alert. Mental status is at baseline.  Psychiatric:        Behavior: Behavior normal.        Thought Content: Thought content normal.        Judgment: Judgment normal.     Ortho Exam Examination of the right wrist is consistent with a small right dorsal wrist ganglion cyst.  Examination of the left hand and wrist is nonfocal.  No focal motor or sensory deficits.  No reproducible numbness. Specialty Comments:  No specialty comments available.  Imaging: No results found.   PMFS History: Patient Active Problem List   Diagnosis Date Noted   Ganglion cyst of dorsum of right wrist 09/27/2023   Numbness of left hand 09/27/2023   Ganglion cyst of tendon sheath of right hand 09/24/2023   Urticaria 08/13/2023   Episode of dizziness 06/06/2023   Paroxysmal tachycardia (HCC) 06/06/2023   Physical exam, pre-employment 12/31/2022   General counseling and advice on female contraception 10/14/2018   Seborrheic dermatitis of scalp 06/02/2018   Routine general medical examination at a health care facility 03/12/2014   Routine sports physical exam 04/20/2013   Allergic rhinitis    Past Medical History:  Diagnosis Date   Abnormal EKG    Allergic rhinitis    Asthma    Sore throat    Tachycardia     Family History  Problem Relation Age of Onset   Asthma Maternal  Grandmother    Cancer Maternal Grandfather    COPD Maternal Grandfather    Stroke Maternal Grandfather    Dementia Other     History reviewed. No pertinent surgical history. Social History   Occupational History   Not on file  Tobacco Use   Smoking status: Never   Smokeless tobacco: Never  Substance and Sexual Activity   Alcohol use: No    Alcohol/week: 0.0 standard drinks of alcohol   Drug use: No   Sexual activity: Never

## 2023-10-30 ENCOUNTER — Ambulatory Visit: Payer: PRIVATE HEALTH INSURANCE | Admitting: Family Medicine

## 2023-10-30 ENCOUNTER — Ambulatory Visit: Payer: Self-pay | Admitting: Family Medicine

## 2023-10-30 ENCOUNTER — Encounter: Payer: Self-pay | Admitting: Family Medicine

## 2023-10-30 VITALS — BP 100/64 | HR 86 | Temp 97.9°F | Ht 61.0 in | Wt 124.1 lb

## 2023-10-30 DIAGNOSIS — S09302A Unspecified injury of left middle and inner ear, initial encounter: Secondary | ICD-10-CM

## 2023-10-30 MED ORDER — CETIRIZINE HCL 10 MG PO TABS: ORAL_TABLET | ORAL | 1 refills | Status: DC

## 2023-10-30 NOTE — Progress Notes (Unsigned)
 Landy Mace T. Ciji Boston, MD, CAQ Sports Medicine [facility] [address] [place], [address]  Phone: [phone]  FAX: [phone]  Robin Ford - 23 y.o. female  MRN [id number]  Date of Birth: 2000/08/29  Date: 10/30/2023  PCP: Judy Pimple, MD  Referral: Judy Pimple, MD  Chief Complaint  Patient presents with   Ear Pain    Left   Subjective:   Robin Ford is a 23 y.o. very pleasant female patient with Body mass index is 23.45 kg/m. who presents with the following:  Patient was using a Q-tip yesterday and felt like she hit her tympanic membrane.  She did have some blood in her ear canal earlier in the day.  She is hearing okay, but she does have some ear pain.  Prior to this incident with a Q-tip, she was not having any issues at all no pain and no discharge from the ear.  L ear barotrauma   Review of Systems is noted in the HPI, as appropriate  Objective:   BP 100/64 (BP Location: Right Arm, Patient Position: Sitting, Cuff Size: Normal)   Pulse 86   Temp 97.9 F (36.6 C) (Temporal)   Ht 5\' 1"  (1.549 m)   Wt 124 lb 2 oz (56.3 kg)   LMP 10/15/2023   SpO2 99%   BMI 23.45 kg/m   GEN: No acute distress; alert,appropriate. PULM: Breathing comfortably in no respiratory distress PSYCH: Normally interactive.  Right TM appears normal Left TM does have a small amount of blood roughly in the 5 o'clock position and a little bit in the canal.  Laboratory and Imaging Data:  Assessment and Plan:     ICD-10-CM   1. Injury of tympanic membrane of left ear, initial encounter  S09.302A      I do think she had some trauma due to the ear drum, however certainly not a large disruption.  At the area of the blood on the eardrum, I am suspicious that she probably has a small tear and injury to the tympanic membrane.  This should heal with time alone.  Reassurance.  Medication Management during today's office visit: Meds ordered this  encounter  Medications   cetirizine (ZYRTEC) 10 MG tablet    Sig: TAKE 1 TABLET BY MOUTH EVERY DAY AS NEEDED FOR ALLERGY    Dispense:  90 tablet    Refill:  1   Medications Discontinued During This Encounter  Medication Reason   cetirizine (ZYRTEC) 10 MG tablet Reorder    Orders placed today for conditions managed today: No orders of the defined types were placed in this encounter.   Disposition: No follow-ups on file.  [employer] One speech-to-text software was used for transcription in this dictation.  Possible transcriptional errors can occur using Animal nutritionist.   Signed,  Elpidio Galea. Maday Guarino, MD   Outpatient Encounter Medications as of 10/30/2023  Medication Sig   albuterol (VENTOLIN HFA) 108 (90 Base) MCG/ACT inhaler Inhale 1-2 puffs into the lungs every 4 (four) hours as needed for wheezing or shortness of breath.   EPINEPHrine 0.3 mg/0.3 mL IJ SOAJ injection Inject 0.3 mg into the muscle once as needed (for severe allergic reaction).   SRONYX 0.1-20 MG-MCG tablet TAKE 1 TABLET BY MOUTH EVERY DAY   [DISCONTINUED] cetirizine (ZYRTEC) 10 MG tablet TAKE 1 TABLET BY MOUTH EVERY DAY AS NEEDED FOR ALLERGY   cetirizine (ZYRTEC) 10 MG tablet TAKE 1 TABLET BY MOUTH EVERY  DAY AS NEEDED FOR ALLERGY   No facility-administered encounter medications on file as of 10/30/2023.

## 2023-10-30 NOTE — Telephone Encounter (Signed)
 Appt scheduled with Dr. Patsy Lager, will route to him and PCP as a fyi

## 2023-10-30 NOTE — Patient Instructions (Signed)
(  You could also use other over the counter "Ear Wax Removal" systems and solutions like Debrox.)  2. Do for 4 - 7 days, then fush with over the counter rubber ear flush (Usually blue or green, but can be any color. Usually beside the "Ear Wax Removal" drops. You can use the bulbs that are used for nasal suction in babies, too.)  3. Do not use Q-tips in the ear - it packs the wax

## 2023-10-30 NOTE — Telephone Encounter (Signed)
  Chief Complaint: Ear Injury/Ear Pain Symptoms: bleeding from ear, tender, burning sensation Frequency: last night Pertinent Negatives: Patient denies fever Disposition: [] ED /[] Urgent Care (no appt availability in office) / [x] Appointment(In office/virtual)/ []  [facility] [facility]/ [] Home Care/ [] Refused Recommended Disposition /[] [place] [facility]/ []  Follow-up with PCP Additional Notes: patient called with concerns for possible ear injury after she was cleaning her ears last night with Q-tip. Patient states she thinks she may have caused injury with q-tip. Patient endorses bleeding from the ear, left ear being tender along with burning sensation. No fever. Per protocol, patient is recommended to see PCP. Appointment made for 10/30/2023 at 4:00 pm with another provider in PCP office. Patient verbalized understanding and all questions answered.    Copied from CRM [id number]. Topic: Clinical - Red Word Triage >> Oct 30, 2023  9:03 AM Chantha C wrote: Red Word that prompted transfer to Nurse Triage: Patient states bleeding in left ear, tender, burning sensation, painful when blowing her nose from the pressure. Patient denies a fever, headaches, dizziness, and vision issues. Patient is worried about a rupture ear drum. Please advise [phone]. Reason for Disposition  [1] Cotton swab (e.g., Q-tip) inserted with force AND [2] pain persists > 30 minutes  Answer Assessment - Initial Assessment Questions 1. MECHANISM: "How did the injury happen?"      Patient states she was cleaning her ear last night using a Q-tip and potentially caused an injury 2. ONSET: "When did the injury happen?" (Minutes or hours ago)      Last night 3. LOCATION: "What part of the ear is injured?" "Which each is injured?"     Left ear-inside of ear-had bleeding from the ear last night and some today 4. APPEARANCE: "What does the ear look like?"      Red on the inside-possibly swelling but not a lot according to  patient 5. HEARING: "Was the hearing damaged?"      Patient doesn't see any damage 6. SIZE: For cuts, bruises, or swelling, ask: "How large is it?" (e.g., inches or centimeters)     N/A 7. PAIN: "Is it painful?" If Yes, ask: "How bad is the pain?"    (e.g., Scale 1-10; or mild, moderate, severe)   - MILD (1-3): doesn't interfere with normal activities    - MODERATE (4-7): interferes with normal activities or awakens from sleep    - SEVERE (8-10): excruciating pain, unable to do any normal activities      2 out of 10 8. TETANUS: For any breaks in the skin, ask: "When was the last tetanus booster?"     N/A 9. OTHER SYMPTOMS: "Do you have any other symptoms?" (e.g., neck pain, headache, loss of consciousness)     No  Protocols used: Ear Injury-A-AH

## 2023-11-16 ENCOUNTER — Other Ambulatory Visit: Payer: Self-pay | Admitting: Family Medicine

## 2023-11-28 ENCOUNTER — Telehealth: Payer: Self-pay | Admitting: Orthopaedic Surgery

## 2023-11-28 NOTE — Telephone Encounter (Signed)
 Patient wouild like to schedule surgery to have the cyst removed from right wrist.  Patient's mother would like to schedule at [facility].  Please provide surgery order.

## 2023-12-02 NOTE — Telephone Encounter (Signed)
 done

## 2023-12-05 ENCOUNTER — Other Ambulatory Visit: Payer: Self-pay

## 2023-12-05 ENCOUNTER — Encounter (HOSPITAL_BASED_OUTPATIENT_CLINIC_OR_DEPARTMENT_OTHER): Payer: Self-pay | Admitting: Orthopaedic Surgery

## 2023-12-10 ENCOUNTER — Encounter (HOSPITAL_BASED_OUTPATIENT_CLINIC_OR_DEPARTMENT_OTHER): Payer: Self-pay | Admitting: Anesthesiology

## 2023-12-11 ENCOUNTER — Encounter (HOSPITAL_BASED_OUTPATIENT_CLINIC_OR_DEPARTMENT_OTHER): Payer: Self-pay | Admitting: Anesthesiology

## 2023-12-11 ENCOUNTER — Other Ambulatory Visit: Payer: Self-pay | Admitting: Physician Assistant

## 2023-12-11 ENCOUNTER — Ambulatory Visit (HOSPITAL_BASED_OUTPATIENT_CLINIC_OR_DEPARTMENT_OTHER)
Admission: RE | Admit: 2023-12-11 | Payer: PRIVATE HEALTH INSURANCE | Source: Home / Self Care | Admitting: Orthopaedic Surgery

## 2023-12-11 DIAGNOSIS — Z01818 Encounter for other preprocedural examination: Secondary | ICD-10-CM

## 2023-12-11 SURGERY — EXCISION, GANGLION CYST, WRIST
Anesthesia: Regional | Site: Wrist | Laterality: Right

## 2023-12-11 MED ORDER — ONDANSETRON HCL 4 MG PO TABS: 4.0000 mg | ORAL_TABLET | Freq: Three times a day (TID) | ORAL | 0 refills | Status: DC | PRN

## 2023-12-11 MED ORDER — HYDROCODONE-ACETAMINOPHEN 5-325 MG PO TABS
1.0000 | ORAL_TABLET | Freq: Three times a day (TID) | ORAL | 0 refills | Status: DC | PRN
Start: 2023-12-11 — End: 2024-02-05

## 2023-12-25 ENCOUNTER — Encounter (HOSPITAL_BASED_OUTPATIENT_CLINIC_OR_DEPARTMENT_OTHER)
Admission: RE | Disposition: A | Discharge status: Home / Self Care | Payer: Self-pay | Source: Home / Self Care | Attending: Orthopaedic Surgery

## 2023-12-25 ENCOUNTER — Ambulatory Visit (HOSPITAL_BASED_OUTPATIENT_CLINIC_OR_DEPARTMENT_OTHER)
Admission: RE | Admit: 2023-12-25 | Discharge: 2023-12-25 | Disposition: A | Discharge status: Home / Self Care | Payer: PRIVATE HEALTH INSURANCE | Attending: Orthopaedic Surgery | Admitting: Orthopaedic Surgery

## 2023-12-25 ENCOUNTER — Encounter: Payer: PRIVATE HEALTH INSURANCE | Admitting: Physician Assistant

## 2023-12-25 ENCOUNTER — Other Ambulatory Visit: Payer: Self-pay

## 2023-12-25 ENCOUNTER — Ambulatory Visit (HOSPITAL_BASED_OUTPATIENT_CLINIC_OR_DEPARTMENT_OTHER): Payer: PRIVATE HEALTH INSURANCE | Admitting: Anesthesiology

## 2023-12-25 ENCOUNTER — Encounter (HOSPITAL_BASED_OUTPATIENT_CLINIC_OR_DEPARTMENT_OTHER): Payer: Self-pay | Admitting: Orthopaedic Surgery

## 2023-12-25 DIAGNOSIS — J45909 Unspecified asthma, uncomplicated: Secondary | ICD-10-CM | POA: Insufficient documentation

## 2023-12-25 DIAGNOSIS — M67431 Ganglion, right wrist: Secondary | ICD-10-CM

## 2023-12-25 DIAGNOSIS — Z01818 Encounter for other preprocedural examination: Secondary | ICD-10-CM

## 2023-12-25 HISTORY — PX: GANGLION CYST EXCISION: SHX1691

## 2023-12-25 LAB — POCT PREGNANCY, URINE: Preg Test, Ur: NEGATIVE

## 2023-12-25 SURGERY — EXCISION, GANGLION CYST, WRIST
Anesthesia: Monitor Anesthesia Care | Site: Wrist | Laterality: Right

## 2023-12-25 MED ORDER — MIDAZOLAM HCL 5 MG/5ML IJ SOLN
INTRAMUSCULAR | Status: DC | PRN
  Administered 2023-12-25: 2 mg via INTRAVENOUS

## 2023-12-25 MED ORDER — MIDAZOLAM HCL 2 MG/2ML IJ SOLN
INTRAMUSCULAR | Status: AC
Start: 2023-12-25 — End: ?
  Filled 2023-12-25: qty 2

## 2023-12-25 MED ORDER — 0.9 % SODIUM CHLORIDE (POUR BTL) OPTIME
TOPICAL | Status: DC | PRN
  Administered 2023-12-25: 300 mL

## 2023-12-25 MED ORDER — CEFAZOLIN SODIUM-DEXTROSE 2-4 GM/100ML-% IV SOLN
INTRAVENOUS | Status: AC
  Filled 2023-12-25: qty 100

## 2023-12-25 MED ORDER — LACTATED RINGERS IV SOLN: INTRAVENOUS | Status: DC

## 2023-12-25 MED ORDER — BUPIVACAINE HCL (PF) 0.25 % IJ SOLN
INTRAMUSCULAR | Status: DC | PRN
  Administered 2023-12-25: 10 mL

## 2023-12-25 MED ORDER — FENTANYL CITRATE (PF) 100 MCG/2ML IJ SOLN: 25.0000 ug | INTRAMUSCULAR | Status: DC | PRN

## 2023-12-25 MED ORDER — ONDANSETRON HCL 4 MG/2ML IJ SOLN: 4.0000 mg | Freq: Once | INTRAMUSCULAR | Status: DC | PRN

## 2023-12-25 MED ORDER — PROPOFOL 500 MG/50ML IV EMUL
INTRAVENOUS | Status: DC | PRN
  Administered 2023-12-25: 125 ug/kg/min via INTRAVENOUS

## 2023-12-25 MED ORDER — CEFAZOLIN SODIUM-DEXTROSE 2-4 GM/100ML-% IV SOLN
2.0000 g | INTRAVENOUS | Status: AC
  Administered 2023-12-25: 2 g via INTRAVENOUS

## 2023-12-25 MED ORDER — ONDANSETRON HCL 4 MG/2ML IJ SOLN
INTRAMUSCULAR | Status: AC
  Filled 2023-12-25: qty 2

## 2023-12-25 MED ORDER — OXYCODONE HCL 5 MG PO TABS
5.0000 mg | ORAL_TABLET | Freq: Once | ORAL | Status: AC | PRN
  Administered 2023-12-25: 5 mg via ORAL

## 2023-12-25 MED ORDER — FENTANYL CITRATE (PF) 100 MCG/2ML IJ SOLN
INTRAMUSCULAR | Status: DC | PRN
  Administered 2023-12-25 (×2): 50 ug via INTRAVENOUS

## 2023-12-25 MED ORDER — FENTANYL CITRATE (PF) 100 MCG/2ML IJ SOLN
INTRAMUSCULAR | Status: AC
  Filled 2023-12-25: qty 2

## 2023-12-25 MED ORDER — OXYCODONE HCL 5 MG PO TABS
ORAL_TABLET | ORAL | Status: AC
Start: 2023-12-25 — End: ?
  Filled 2023-12-25: qty 1

## 2023-12-25 MED ORDER — BUPIVACAINE HCL (PF) 0.25 % IJ SOLN
INTRAMUSCULAR | Status: AC
  Filled 2023-12-25: qty 30

## 2023-12-25 MED ORDER — LIDOCAINE HCL (CARDIAC) PF 100 MG/5ML IV SOSY
PREFILLED_SYRINGE | INTRAVENOUS | Status: DC | PRN
  Administered 2023-12-25: 20 mg via INTRAVENOUS

## 2023-12-25 MED ORDER — ONDANSETRON HCL 4 MG/2ML IJ SOLN
INTRAMUSCULAR | Status: DC | PRN
  Administered 2023-12-25: 4 mg via INTRAVENOUS

## 2023-12-25 MED ORDER — LIDOCAINE HCL (PF) 0.5 % IJ SOLN
INTRAMUSCULAR | Status: DC | PRN
  Administered 2023-12-25: 30 mL via INTRAVENOUS

## 2023-12-25 MED ORDER — OXYCODONE HCL 5 MG/5ML PO SOLN: 5.0000 mg | Freq: Once | ORAL | Status: AC | PRN

## 2023-12-25 MED ORDER — LIDOCAINE 2% (20 MG/ML) 5 ML SYRINGE
INTRAMUSCULAR | Status: AC
  Filled 2023-12-25: qty 5

## 2023-12-25 SURGICAL SUPPLY — 46 items
BAND RUBBER #18 3X1/16 STRL (MISCELLANEOUS) ×1 IMPLANT
BLADE ARTHRO LOK 4 BEAVER (BLADE) IMPLANT
BLADE SURG 15 STRL LF DISP TIS (BLADE) ×2 IMPLANT
BNDG COHESIVE 1X5 TAN STRL LF (GAUZE/BANDAGES/DRESSINGS) IMPLANT
BNDG ELASTIC 3INX 5YD STR LF (GAUZE/BANDAGES/DRESSINGS) IMPLANT
BNDG ESMARK 4X9 LF (GAUZE/BANDAGES/DRESSINGS) ×1 IMPLANT
BRUSH SCRUB EZ PLAIN DRY (MISCELLANEOUS) ×1 IMPLANT
CORD BIPOLAR FORCEPS 12FT (ELECTRODE) ×1 IMPLANT
COVER BACK TABLE 60X90IN (DRAPES) ×1 IMPLANT
COVER MAYO STAND STRL (DRAPES) ×1 IMPLANT
CUFF TOURN SGL QUICK 18X4 (TOURNIQUET CUFF) IMPLANT
DERMABOND ADVANCED .7 DNX12 (GAUZE/BANDAGES/DRESSINGS) IMPLANT
DRAPE EXTREMITY T 121X128X90 (DISPOSABLE) ×1 IMPLANT
DRAPE IMP U-DRAPE 54X76 (DRAPES) ×1 IMPLANT
DRAPE U-SHAPE 47X51 STRL (DRAPES) ×1 IMPLANT
GAUZE 4X4 16PLY ~~LOC~~+RFID DBL (SPONGE) IMPLANT
GAUZE SPONGE 4X4 12PLY STRL (GAUZE/BANDAGES/DRESSINGS) ×1 IMPLANT
GAUZE STRETCH 2X75IN STRL (MISCELLANEOUS) ×1 IMPLANT
GAUZE XEROFORM 1X8 LF (GAUZE/BANDAGES/DRESSINGS) ×1 IMPLANT
GLOVE BIOGEL PI IND STRL 7.5 (GLOVE) ×1 IMPLANT
GLOVE ECLIPSE 7.0 STRL STRAW (GLOVE) ×1 IMPLANT
GLOVE INDICATOR 7.0 STRL GRN (GLOVE) ×1 IMPLANT
GLOVE SURG SYN 7.5 E (GLOVE) ×2 IMPLANT
GLOVE SURG SYN 7.5 PF PI (GLOVE) ×2 IMPLANT
GOWN STRL REUS W/ TWL XL LVL3 (GOWN DISPOSABLE) ×1 IMPLANT
GOWN STRL SURGICAL XL XLNG (GOWN DISPOSABLE) ×2 IMPLANT
NDL HYPO 25X1 1.5 SAFETY (NEEDLE) IMPLANT
NEEDLE HYPO 25X1 1.5 SAFETY (NEEDLE) IMPLANT
NS IRRIG 1000ML POUR BTL (IV SOLUTION) IMPLANT
PACK BASIN DAY SURGERY FS (CUSTOM PROCEDURE TRAY) ×1 IMPLANT
SHEET MEDIUM DRAPE 40X70 STRL (DRAPES) ×1 IMPLANT
SPIKE FLUID TRANSFER (MISCELLANEOUS) IMPLANT
STOCKINETTE 4X48 STRL (DRAPES) IMPLANT
SUCTION TUBE FRAZIER 10FR DISP (SUCTIONS) ×1 IMPLANT
SUT ETHILON 2 0 FS 18 (SUTURE) IMPLANT
SUT ETHILON 3 0 PS 1 (SUTURE) ×1 IMPLANT
SUT ETHILON 4 0 PS 2 18 (SUTURE) ×1 IMPLANT
SUT MNCRL AB 3-0 PS2 18 (SUTURE) IMPLANT
SUT VIC AB 0 CT1 27XBRD ANBCTR (SUTURE) IMPLANT
SUT VIC AB 2-0 CT1 TAPERPNT 27 (SUTURE) IMPLANT
SUT VIC AB 3-0 SH 27X BRD (SUTURE) ×1 IMPLANT
SYR BULB EAR ULCER 3OZ GRN STR (SYRINGE) IMPLANT
SYR CONTROL 10ML LL (SYRINGE) IMPLANT
TOWEL GREEN STERILE FF (TOWEL DISPOSABLE) ×1 IMPLANT
TRAY DSU PREP LF (CUSTOM PROCEDURE TRAY) ×1 IMPLANT
TUBE CONNECTING 20X1/4 (TUBING) ×1 IMPLANT

## 2023-12-25 NOTE — Op Note (Signed)
   Date of Surgery: 12/25/2023  INDICATIONS: Ms. Ocana is a 23 y.o.-year-old female with a right dorsal wrist ganglion cyst.  The patient did consent to the procedure after discussion of the risks and benefits.  PREOPERATIVE DIAGNOSIS: Right dorsal wrist ganglion cyst  POSTOPERATIVE DIAGNOSIS: Same.  PROCEDURE:  Removal of right dorsal wrist ganglion cyst Tenolysis of EPL tendon  SURGEON: N. Claria Crofts, M.D.  ASSIST: Kitty Perkins [place], [place]; necessary for the timely completion of procedure and due to complexity of procedure.  ANESTHESIA:  Bier block, local  IV FLUIDS AND URINE: See anesthesia.  ESTIMATED BLOOD LOSS: minimal mL.  IMPLANTS: None  DRAINS: None  COMPLICATIONS: see description of procedure.  DESCRIPTION OF PROCEDURE: The patient was brought to the operating room.  The patient had been signed prior to the procedure and this was documented.  Nonsterile tourniquet was placed on the upper forearm.  Esmarch was used to exsanguinate the extremity and a Bier block was administered.  A time-out was performed to confirm that this was the correct patient, site, side and location. The patient did receive antibiotics prior to the incision and was re-dosed during the procedure as needed at indicated intervals.  The patient had the operative extremity prepped and draped in the standard surgical fashion.    The ganglion cyst was palpated dorsally and a transverse incision was made.  Dissection was carried down through the subcutaneous tissue.  Cutaneous nerves were identified and mobilized aside.  The cyst was adhered to the undersurface of the EPL tendon.  Tenolysis of the EPL was performed.  The EPL was then mobilized radially.  We continued our blunt dissection circumferentially around the cyst onto the dorsal wrist capsule.  The stalk was identified.  This was elevated off of the dorsal wrist capsule with bipolar.  The specimen was sent for pathology.  The surgical site was then  thoroughly irrigated and closed in a layered fashion using 3-0 Vicryl and 3-0 Monocryl Steri-Strips.  Bulky sterile dressings were applied.  Patient tolerated procedure well had no any complications.  Trellis Fries was necessary for opening, closing, retracting, limb positioning and overall facilitation and timely completion of the procedure.  POSTOPERATIVE PLAN: Patient will be discharged home and follow-up in 2 weeks for recheck.  Sidonie Drape, MD 12:45 PM

## 2023-12-25 NOTE — H&P (Signed)
 PREOPERATIVE H&P  Chief Complaint: right wrist ganglion cyst  HPI: Robin Ford is a 23 y.o. female who presents for surgical treatment of right wrist ganglion cyst.  She denies any changes in medical history.  History reviewed. No pertinent surgical history. Social History   Socioeconomic History   Marital status: Single    Spouse name: Not on file   Number of children: Not on file   Years of education: Not on file   Highest education level: Bachelor's degree (e.g., BA, AB, BS)  Occupational History   Not on file  Tobacco Use   Smoking status: Never   Smokeless tobacco: Never  Vaping Use   Vaping status: Never Used  Substance and Sexual Activity   Alcohol use: No    Alcohol/week: 0.0 standard drinks of alcohol   Drug use: No   Sexual activity: Never  Other Topics Concern   Not on file  Social History Narrative   [employer], lives at home with mom and dad, no smokers, no pets   Social Drivers of Corporate investment banker Strain: Patient Declined (08/12/2023)   Overall Financial Resource Strain (CARDIA)    Difficulty of Paying Living Expenses: Patient declined  Food Insecurity: Patient Declined (08/12/2023)   Hunger Vital Sign    Worried About Running Out of Food in the Last Year: Patient declined    Ran Out of Food in the Last Year: Patient declined  Transportation Needs: No Transportation Needs (08/12/2023)   PRAPARE - Administrator, Civil Service (Medical): No    Lack of Transportation (Non-Medical): No  Physical Activity: Patient Declined (08/12/2023)   Exercise Vital Sign    Days of Exercise per Week: Patient declined    Minutes of Exercise per Session: Patient declined  Stress: Patient Declined (08/12/2023)   [name]    Feeling of Stress : Patient declined  Social Connections: Unknown (08/12/2023)   Social Connection and Isolation Panel [NHANES]     Frequency of Communication with Friends and Family: More than three times a week    Frequency of Social Gatherings with Friends and Family: Once a week    Attends Religious Services: Patient declined    Database administrator or Organizations: No    Attends Engineer, structural: Not on file    Marital Status: Never married   Family History  Problem Relation Age of Onset   Asthma Maternal Grandmother    Cancer Maternal Grandfather    COPD Maternal Grandfather    Stroke Maternal Grandfather    Dementia Other    Allergies  Allergen Reactions   Bee Venom Anaphylaxis   Penicillins Hives and Other (See Comments)    Has patient had a PCN reaction causing immediate rash, facial/tongue/throat swelling, SOB or lightheadedness with hypotension: No Has patient had a PCN reaction causing severe rash involving mucus membranes or skin necrosis: No Has patient had a PCN reaction that required hospitalization No Has patient had a PCN reaction occurring within the last 10 years: Yes If all of the above answers are "NO", then may proceed with Cephalosporin use.   Tessalon  [Benzonatate ]     rash   Prior to Admission medications   Medication Sig Start Date End Date Taking? Authorizing Provider  cetirizine  (ZYRTEC ) 10 MG tablet TAKE 1 TABLET BY MOUTH EVERY DAY AS NEEDED FOR ALLERGY 10/30/23  Yes Tower, Manley Seeds, MD  levonorgestrel -ethinyl estradiol (VIENVA)  0.1-20 MG-MCG tablet TAKE 1 TABLET BY MOUTH EVERY DAY 11/19/23  Yes Tower, Manley Seeds, MD  albuterol  (VENTOLIN  HFA) 108 (90 Base) MCG/ACT inhaler Inhale 1-2 puffs into the lungs every 4 (four) hours as needed for wheezing or shortness of breath. 03/15/23   Tower, Manley Seeds, MD  EPINEPHrine  0.3 mg/0.3 mL IJ SOAJ injection Inject 0.3 mg into the muscle once as needed (for severe allergic reaction). 08/13/23   Tower, Manley Seeds, MD  HYDROcodone -acetaminophen  (NORCO/VICODIN) 5-325 MG tablet Take 1 tablet by mouth 3 (three) times daily as needed for moderate  pain (pain score 4-6). 12/11/23   Sandie Cross, PA-C  ondansetron  (ZOFRAN ) 4 MG tablet Take 1 tablet (4 mg total) by mouth every 8 (eight) hours as needed for nausea or vomiting. 12/11/23   Sandie Cross, PA-C     Positive ROS: All other systems have been reviewed and were otherwise negative with the exception of those mentioned in the HPI and as above.  Physical Exam: General: Alert, no acute distress Cardiovascular: No pedal edema Respiratory: No cyanosis, no use of accessory musculature GI: abdomen soft Skin: No lesions in the area of chief complaint Neurologic: Sensation intact distally Psychiatric: Patient is competent for consent with normal mood and affect Lymphatic: no lymphedema  MUSCULOSKELETAL: exam stable  Assessment: right wrist ganglion cyst  Plan: Plan for Procedure(s): EXCISION, GANGLION CYST, WRIST  The risks benefits and alternatives were discussed with the patient including but not limited to the risks of nonoperative treatment, versus surgical intervention including infection, bleeding, nerve injury,  blood clots, cardiopulmonary complications, morbidity, mortality, among others, and they were willing to proceed.   Claria Crofts, MD 12/25/2023 10:24 AM

## 2023-12-25 NOTE — Anesthesia Preprocedure Evaluation (Signed)
 Anesthesia Evaluation  Patient identified by MRN, date of birth, ID band Patient awake    Reviewed: Allergy & Precautions, NPO status , Patient's Chart, lab work & pertinent test results  Airway Mallampati: I  TM Distance: >3 FB     Dental no notable dental hx. (+) Dental Advisory Given, Teeth Intact   Pulmonary asthma    Pulmonary exam normal breath sounds clear to auscultation       Cardiovascular negative cardio ROS Normal cardiovascular exam Rhythm:Regular Rate:Normal     Neuro/Psych negative neurological ROS  negative psych ROS   GI/Hepatic negative GI ROS, Neg liver ROS,,,  Endo/Other  negative endocrine ROS    Renal/GU negative Renal ROS  negative genitourinary   Musculoskeletal Ganglion cyst right wrist   Abdominal   Peds  Hematology negative hematology ROS (+)   Anesthesia Other Findings   Reproductive/Obstetrics                             Anesthesia Physical Anesthesia Plan  ASA: 2  Anesthesia Plan: MAC and Bier Block and Bier Block-Lidocaine Only   Post-op Pain Management: Minimal or no pain anticipated and Regional block*   Induction: Intravenous  PONV Risk Score and Plan: 3 and Treatment may vary due to age or medical condition  Airway Management Planned: Natural Airway and Nasal Cannula  Additional Equipment: None  Intra-op Plan:   Post-operative Plan: Extubation in OR  Informed Consent: I have reviewed the patients History and Physical, chart, labs and discussed the procedure including the risks, benefits and alternatives for the proposed anesthesia with the patient or authorized representative who has indicated his/her understanding and acceptance.     Dental advisory given  Plan Discussed with: CRNA and Anesthesiologist  Anesthesia Plan Comments:        Anesthesia Quick Evaluation

## 2023-12-25 NOTE — Transfer of Care (Signed)
 Immediate Anesthesia Transfer of Care Note  Patient: Robin Ford  Procedure(s) Performed: EXCISION, GANGLION CYST, WRIST (Right: Wrist)  Patient Location: PACU  Anesthesia Type:MAC and Bier block  Level of Consciousness: awake, alert , and oriented  Airway & Oxygen Therapy: Patient Spontanous Breathing and Patient connected to face mask oxygen  Post-op Assessment: Report given to RN and Post -op Vital signs reviewed and stable  Post vital signs: Reviewed and stable  Last Vitals:  Vitals Value Taken Time  BP 109/47 12/25/23 1247  Temp 36.5 C 12/25/23 1247  Pulse 78 12/25/23 1248  Resp 13 12/25/23 1248  SpO2 98 % 12/25/23 1248  Vitals shown include unfiled device data.  Last Pain:  Vitals:   12/25/23 1020  TempSrc: Oral  PainSc: 0-No pain         Complications: No notable events documented.

## 2023-12-25 NOTE — Discharge Instructions (Addendum)
 Postoperative instructions:  Weightbearing instructions: as tolerated  Keep your dressing and/or splint clean and dry at all times.  You can remove your dressing on post-operative day #3 and change with a dry/sterile dressing or Band-Aids as needed thereafter.    Incision instructions:  Do not soak your incision for 3 weeks after surgery.  If the incision gets wet, pat dry and do not scrub the incision.  Pain control:  You have been given a prescription to be taken as directed for post-operative pain control.  In addition, elevate the operative extremity above the heart at all times to prevent swelling and throbbing pain.  Take over-the-counter Colace, 100mg  by mouth twice a day while taking narcotic pain medications to help prevent constipation.  Follow up appointments: 1) 14 days for suture removal and wound check. 2) Dr. Christiane Cowing as scheduled.   -------------------------------------------------------------------------------------------------------------  After Surgery Pain Control:  After your surgery, post-surgical discomfort or pain is likely. This discomfort can last several days to a few weeks. At certain times of the day your discomfort may be more intense.  Did you receive a nerve block?  A nerve block can provide pain relief for one hour to two days after your surgery. As long as the nerve block is working, you will experience little or no sensation in the area the surgeon operated on.  As the nerve block wears off, you will begin to experience pain or discomfort. It is very important that you begin taking your prescribed pain medication before the nerve block fully wears off. Treating your pain at the first sign of the block wearing off will ensure your pain is better controlled and more tolerable when full-sensation returns. Do not wait until the pain is intolerable, as the medicine will be less effective. It is better to treat pain in advance than to try and catch up.  General  Anesthesia:  If you did not receive a nerve block during your surgery, you will need to start taking your pain medication shortly after your surgery and should continue to do so as prescribed by your surgeon.  Pain Medication:  Most commonly we prescribe Vicodin and Percocet for post-operative pain. Both of these medications contain a combination of acetaminophen  (Tylenol ) and a narcotic to help control pain.   It takes between 30 and 45 minutes before pain medication starts to work. It is important to take your medication before your pain level gets too intense.   Nausea is a common side effect of many pain medications. You will want to eat something before taking your pain medicine to help prevent nausea.   If you are taking a prescription pain medication that contains acetaminophen , we recommend that you do not take additional over the counter acetaminophen  (Tylenol ).  Other pain relieving options:   Using a cold pack to ice the affected area a few times a day (15 to 20 minutes at a time) can help to relieve pain, reduce swelling and bruising.   Elevation of the affected area can also help to reduce pain and swelling.  Per [facility] clinic policy, our goal is ensure optimal postoperative pain control with a multimodal pain management strategy. For all [facility] patients, our goal is to wean post-operative narcotic medications by 6 weeks post-operatively. If this is not possible due to utilization of pain medication prior to surgery, your [facility] doctor will support your acute post-operative pain control for the first 6 weeks postoperatively, with a plan to transition you back to your primary  pain team following that. Max Spain will work to ensure a Therapist, occupational.   Post Anesthesia Home Care Instructions  Activity: Get plenty of rest for the remainder of the day. A responsible individual must stay with you for 24 hours following the procedure.  For the next 24 hours, DO NOT: -Drive a  car -Advertising copywriter -Drink alcoholic beverages -Take any medication unless instructed by your physician -Make any legal decisions or sign important papers.  Meals: Start with liquid foods such as gelatin or soup. Progress to regular foods as tolerated. Avoid greasy, spicy, heavy foods. If nausea and/or vomiting occur, drink only clear liquids until the nausea and/or vomiting subsides. Call your physician if vomiting continues.  Special Instructions/Symptoms: Your throat may feel dry or sore from the anesthesia or the breathing tube placed in your throat during surgery. If this causes discomfort, gargle with warm salt water. The discomfort should disappear within 24 hours.  If you had a scopolamine patch placed behind your ear for the management of post- operative nausea and/or vomiting:  1. The medication in the patch is effective for 72 hours, after which it should be removed.  Wrap patch in a tissue and discard in the trash. Wash hands thoroughly with soap and water. 2. You may remove the patch earlier than 72 hours if you experience unpleasant side effects which may include dry mouth, dizziness or visual disturbances. 3. Avoid touching the patch. Wash your hands with soap and water after contact with the patch.

## 2023-12-25 NOTE — Anesthesia Postprocedure Evaluation (Signed)
 Anesthesia Post Note  Patient: Robin Ford  Procedure(s) Performed: EXCISION, GANGLION CYST, WRIST (Right: Wrist)     Patient location during evaluation: PACU Anesthesia Type: MAC Level of consciousness: awake and alert and oriented Pain management: pain level controlled Vital Signs Assessment: post-procedure vital signs reviewed and stable Respiratory status: spontaneous breathing, nonlabored ventilation and respiratory function stable Cardiovascular status: stable and blood pressure returned to baseline Postop Assessment: no apparent nausea or vomiting Anesthetic complications: no   No notable events documented.  Last Vitals:  Vitals:   12/25/23 1300 12/25/23 1315  BP: 110/62 138/69  Pulse: 73 70  Resp: 16 16  Temp:  (!) 36.4 C  SpO2: 98% 100%    Last Pain:  Vitals:   12/25/23 1315  TempSrc:   PainSc: 3                  Ashwini Jago A.

## 2023-12-26 ENCOUNTER — Encounter (HOSPITAL_BASED_OUTPATIENT_CLINIC_OR_DEPARTMENT_OTHER): Payer: Self-pay | Admitting: Orthopaedic Surgery

## 2023-12-26 LAB — SURGICAL PATHOLOGY

## 2024-01-08 ENCOUNTER — Ambulatory Visit (INDEPENDENT_AMBULATORY_CARE_PROVIDER_SITE_OTHER): Payer: PRIVATE HEALTH INSURANCE | Admitting: Physician Assistant

## 2024-01-08 DIAGNOSIS — M67431 Ganglion, right wrist: Secondary | ICD-10-CM

## 2024-01-08 NOTE — Progress Notes (Signed)
   Post-Op Visit Note   Patient: Robin Ford           Date of Birth: 10/29/2000           MRN: [id number] Visit Date: 01/08/2024 PCP: Clemens Curt, MD   Assessment & Plan:  Chief Complaint:  Chief Complaint  Patient presents with   Right Wrist - Routine Post Op   Visit Diagnoses:  1. Ganglion cyst of dorsum of right wrist     Plan: Patient is a very pleasant 23 year old female who comes in today 2 weeks status post excision right wrist ganglion cyst, date of surgery 12/25/2023.  Surgical pathology came back consistent with ganglion cyst.  She has been doing well.  She does note some soreness to the dorsum of the hand especially with typing.  She is taking over-the-counter medication as needed for pain.  Examination of the right hand reveals a well healed surgical incision.  No evidence of infection or cellulitis.  Fingers are warm well-perfused.  She is neurovascularly intact distally.  Today, Steri-Strips were applied.  No heavy lifting or submerging her hand underwater for 2 weeks.  She will begin range of motion exercises.  Follow-up in 2 weeks for recheck.  Call with concerns or questions.  Follow-Up Instructions: Return in about 2 weeks (around 01/22/2024).   Orders:  No orders of the defined types were placed in this encounter.  No orders of the defined types were placed in this encounter.   Imaging: No new imaging  PMFS History: Patient Active Problem List   Diagnosis Date Noted   Ganglion cyst of dorsum of right wrist 09/27/2023   Numbness of left hand 09/27/2023   Ganglion cyst of tendon sheath of right hand 09/24/2023   Urticaria 08/13/2023   Episode of dizziness 06/06/2023   Paroxysmal tachycardia (HCC) 06/06/2023   Physical exam, pre-employment 12/31/2022   General counseling and advice on female contraception 10/14/2018   Seborrheic dermatitis of scalp 06/02/2018   Routine general medical examination at a health care facility 03/12/2014   Routine sports  physical exam 04/20/2013   Allergic rhinitis    Past Medical History:  Diagnosis Date   Abnormal EKG    tachycardia   Allergic rhinitis    Asthma    Sore throat    Tachycardia 07/19/2022   90s-100s    Family History  Problem Relation Age of Onset   Asthma Maternal Grandmother    Cancer Maternal Grandfather    COPD Maternal Grandfather    Stroke Maternal Grandfather    Dementia Other     Past Surgical History:  Procedure Laterality Date   GANGLION CYST EXCISION Right 12/25/2023   Procedure: EXCISION, GANGLION CYST, WRIST;  Surgeon: Wes Hamman, MD;  Location: [facility];  Service: Orthopedics;  Laterality: Right;   Social History   Occupational History   Not on file  Tobacco Use   Smoking status: Never   Smokeless tobacco: Never  Vaping Use   Vaping status: Never Used  Substance and Sexual Activity   Alcohol use: No    Alcohol/week: 0.0 standard drinks of alcohol   Drug use: No   Sexual activity: Never

## 2024-01-22 ENCOUNTER — Ambulatory Visit (INDEPENDENT_AMBULATORY_CARE_PROVIDER_SITE_OTHER): Payer: PRIVATE HEALTH INSURANCE | Admitting: Physician Assistant

## 2024-01-22 DIAGNOSIS — M67431 Ganglion, right wrist: Secondary | ICD-10-CM

## 2024-01-22 NOTE — Progress Notes (Signed)
   Post-Op Visit Note   Patient: Robin Ford           Date of Birth: September 24, 2000           MRN: [id number] Visit Date: 01/22/2024 PCP: Clemens Curt, MD   Assessment & Plan:  Chief Complaint:  Chief Complaint  Patient presents with   Right Wrist - Follow-up    EXCISION, GANGLION CYST, WRIST 12/25/2023   Visit Diagnoses:  1. Ganglion cyst of dorsum of right wrist     Plan: Patient is a pleasant 23 year old female who comes in today 4 weeks status post right dorsal ganglion cyst excision, date of surgery 12/25/2023.  She has been doing well.  She does have symptoms of stiffness in the feeling of her wrist needing to pop when she tries to flex her wrist.  Otherwise no complaints.  Examination of the right wrist: Slight limitation with flexion.  Painless dorsiflexion.  She is neurovascularly intact distally.  This point, recommended OT to help with range of motion.  She will follow-up with us  as needed.  Call with concerns or questions.  Follow-Up Instructions: Return if symptoms worsen or fail to improve.   Orders:  Orders Placed This Encounter  Procedures   Ambulatory referral to Occupational Therapy   No orders of the defined types were placed in this encounter.   Imaging: No new imaging  PMFS History: Patient Active Problem List   Diagnosis Date Noted   Ganglion cyst of dorsum of right wrist 09/27/2023   Numbness of left hand 09/27/2023   Ganglion cyst of tendon sheath of right hand 09/24/2023   Urticaria 08/13/2023   Episode of dizziness 06/06/2023   Paroxysmal tachycardia (HCC) 06/06/2023   Physical exam, pre-employment 12/31/2022   General counseling and advice on female contraception 10/14/2018   Seborrheic dermatitis of scalp 06/02/2018   Routine general medical examination at a health care facility 03/12/2014   Routine sports physical exam 04/20/2013   Allergic rhinitis    Past Medical History:  Diagnosis Date   Abnormal EKG    tachycardia   Allergic  rhinitis    Asthma    Sore throat    Tachycardia 07/19/2022   90s-100s    Family History  Problem Relation Age of Onset   Asthma Maternal Grandmother    Cancer Maternal Grandfather    COPD Maternal Grandfather    Stroke Maternal Grandfather    Dementia Other     Past Surgical History:  Procedure Laterality Date   GANGLION CYST EXCISION Right 12/25/2023   Procedure: EXCISION, GANGLION CYST, WRIST;  Surgeon: Wes Hamman, MD;  Location: [facility];  Service: Orthopedics;  Laterality: Right;   Social History   Occupational History   Not on file  Tobacco Use   Smoking status: Never   Smokeless tobacco: Never  Vaping Use   Vaping status: Never Used  Substance and Sexual Activity   Alcohol use: No    Alcohol/week: 0.0 standard drinks of alcohol   Drug use: No   Sexual activity: Never

## 2024-01-24 ENCOUNTER — Encounter: Payer: Self-pay | Admitting: Rehabilitative and Restorative Service Providers"

## 2024-01-24 ENCOUNTER — Ambulatory Visit: Payer: PRIVATE HEALTH INSURANCE | Admitting: Rehabilitative and Restorative Service Providers"

## 2024-01-24 DIAGNOSIS — M25531 Pain in right wrist: Secondary | ICD-10-CM | POA: Diagnosis not present

## 2024-01-24 DIAGNOSIS — M25631 Stiffness of right wrist, not elsewhere classified: Secondary | ICD-10-CM | POA: Diagnosis not present

## 2024-01-24 DIAGNOSIS — M6281 Muscle weakness (generalized): Secondary | ICD-10-CM

## 2024-01-24 NOTE — Therapy (Signed)
 OUTPATIENT OCCUPATIONAL THERAPY ORTHO EVALUATION  Patient Name: Robin Ford MRN: [id number] DOB:[birth year], 23 y.o., female Today's Date: 01/24/2024  PCP: Deri Fleet, MD REFERRING PROVIDER: Sandie Cross, PA-C   END OF SESSION:  OT End of Session - 01/24/24 1017     Visit Number 1    Number of Visits 5    Date for OT Re-Evaluation 02/21/24    Authorization Type [employer]    OT Start Time 1017    OT Stop Time 1106    OT Time Calculation (min) 49 min    Activity Tolerance Patient tolerated treatment well;No increased pain;Patient limited by fatigue;Patient limited by pain    Behavior During Therapy [facility] for tasks assessed/performed          Past Medical History:  Diagnosis Date   Abnormal EKG    tachycardia   Allergic rhinitis    Asthma    Sore throat    Tachycardia 07/19/2022   90s-100s   Past Surgical History:  Procedure Laterality Date   GANGLION CYST EXCISION Right 12/25/2023   Procedure: EXCISION, GANGLION CYST, WRIST;  Surgeon: Wes Hamman, MD;  Location: [facility];  Service: Orthopedics;  Laterality: Right;   Patient Active Problem List   Diagnosis Date Noted   Ganglion cyst of dorsum of right wrist 09/27/2023   Numbness of left hand 09/27/2023   Ganglion cyst of tendon sheath of right hand 09/24/2023   Urticaria 08/13/2023   Episode of dizziness 06/06/2023   Paroxysmal tachycardia (HCC) 06/06/2023   Physical exam, pre-employment 12/31/2022   General counseling and advice on female contraception 10/14/2018   Seborrheic dermatitis of scalp 06/02/2018   Routine general medical examination at a health care facility 03/12/2014   Routine sports physical exam 04/20/2013   Allergic rhinitis     ONSET DATE: 12/25/23: DOS  REFERRING DIAG: C16.606 (ICD-10-CM) - Ganglion cyst of dorsum of right wrist   THERAPY DIAG:  Muscle weakness (generalized)  Pain in right wrist  Stiffness of right wrist, not elsewhere classified  Rationale for  Evaluation and Treatment: Rehabilitation  SUBJECTIVE:   SUBJECTIVE STATEMENT: Now ~4 weeks s/p R wrist ganglion cyst removal. She states that she wore a wrist brace for ~1 week after this sx and now feels stiff, sometimes painful.  She states wrist flexion is painful and she feels tight in her knuckles, etc.  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     PERTINENT HISTORY: S/p right wrist ganglion cyst excision   PRECAUTIONS: None  RED FLAGS: None   WEIGHT BEARING RESTRICTIONS: No  PAIN:  Are you having pain? Yes: NPRS scale: 0/10 at rest up to 5/10 in past week at worst Pain location: Right dorsal wrist just distal to surgical site Pain description: Stiff and sometimes aching Aggravating factors: Wrist flexion or weightbearing Relieving factors: Rest/heat/massage  FALLS: Has patient fallen in last 6 months? No  PLOF: Independent  PATIENT GOALS: To improve pain and stiffness in the right dominant arm and wrist  NEXT MD VISIT: As needed  OBJECTIVE: (All objective assessments below are from initial evaluation on: 01/24/2024 unless otherwise specified.)   HAND DOMINANCE: Right   ADLs: Overall ADLs: States decreased ability to grab, hold household objects, pain and difficulty to open containers   FUNCTIONAL OUTCOME MEASURES: Eval: Patient Specific Functional Scale: 1 (Rotate wrist and driving, push-ups, weight lifting)  (Higher Score  =  Better Ability for the Selected Tasks)  UPPER EXTREMITY ROM     Shoulder to Wrist AROM Right eval  Shoulder flexion   Shoulder abduction   Shoulder extension   Shoulder internal rotation   Shoulder external rotation   Elbow flexion   Elbow extension   Forearm supination   Forearm pronation    Wrist flexion 35  (40 after tx today)   Wrist extension 48  Wrist ulnar deviation   Wrist radial deviation   Functional dart thrower's motion (F-DTM) in ulnar flexion   F-DTM in radial extension    (Blank rows =  not tested)   Hand AROM Right eval  Full Fist Ability (or Gap to Distal Palmar Crease) Forms loose full fist  Thumb Opposition  (Kapandji Scale)  WFL  (Blank rows = not tested)   UPPER EXTREMITY MMT:     MMT Right 01/24/2024  Shoulder flexion   Shoulder abduction   Shoulder adduction   Shoulder extension   Shoulder internal rotation   Shoulder external rotation   Middle trapezius   Lower trapezius   Elbow flexion 5/5  Elbow extension 5/5  Forearm supination 4 -/5  Forearm pronation 4 +/5  Wrist flexion 4 -/5  Wrist extension 4 -/5  Wrist ulnar deviation   Wrist radial deviation   (Blank rows = not tested)  HAND FUNCTION: Eval: Observed weakness in affected right hand, as seen by inability to make a tight full fist.  Details TBD as needed  COORDINATION: Eval: Observed coordination impairments with affected right wrist and arm as seen by stiffness and inhibiting pain in the right wrist.  Details TBD as needed  SENSATION: Eval:  Light touch intact today, no sensory complaints  EDEMA:   Eval: None significant  COGNITION: Eval: Overall cognitive status: WFL for evaluation today   OBSERVATIONS:   Eval: She does appear to have a small suture or stitch coming out of the ulnar side of her surgical area at the dorsal wrist.  OT was able to have her meet with the physicians assistant quickly to have this stitch dealt with.  It was extracted and cut short as best as possible, but a portion is still in her wrist and not totally dissolved.  She was given bacitracin and a Band-Aid to cover this afterward.  She does seem to have some stiffness around the scaphoid and it is somewhat volar today.   TODAY'S TREATMENT:  Post-evaluation treatment:   Today, OT performs some manual therapy (scar mobility, joint mobilizations, IASTM, stretches) with her along with education for how to do scar mobility, self stretches, active exercises, radial nerve glides as an active stretch.  After OT  does some of these things with her manually, her motion does improve by 5 degrees, which is encouraging that if she does this program consistently she will get some relief.  As her tightness may be somewhat capsular, OT also educates quickly on dorsal taping technique but recommends to stick with a typical stretch routine for the first week-just make it very consistent.  She can use moist heat to loosen up first, do scar mobility and nerve gliding and set active warm up, then do big and long self stretches.  She tolerates these well today, states understanding.  She will return next week to see how her motion has been affected   Exercises - Standing Radial Nerve Glide  - 4-6 x daily - 1 sets - 10-15 reps - Forearm Pronation Stretch  - 3-4 x daily - 3-5 reps - 15 sec  hold - Wrist Flexion Stretch  - 4 x daily - 3-5 reps - 15 sec hold - Wrist Prayer Stretch  - 4 x daily - 3-5 reps - 15 sec hold - Seated Wrist Ulnar Deviation Stretch  - 3-5 x daily - 3-5 reps - 15 sec hold  PATIENT EDUCATION: Education details: See tx section above for details  Person educated: Patient Education method: Verbal Instruction, Teach back, Handouts  Education comprehension: States and demonstrates understanding, Additional Education required    HOME EXERCISE PROGRAM: Access Code: [email or web address] URL: [email or web address] Date: 01/24/2024 Prepared by: Leartis Proud   GOALS: Goals reviewed with patient? Yes   SHORT TERM GOALS: (STG required if POC>30 days) Target Date: 01/31/2024  Pt will obtain protective, custom orthotic. Goal status: TBD/PRN  2.  Pt will demo/state understanding of initial HEP to improve pain levels and prerequisite motion. Goal status: INITIAL   LONG TERM GOALS: Target Date: 02/21/2024  Pt will improve functional ability by decreased impairment per PSFS assessment from 1 to 6 or better, for better quality of life. Goal status: INITIAL  2.  Pt will improve grip strength  in right hand from TBD lbs to at least 45 lbs for functional use at home and in IADLs. Goal status: INITIAL  3.  Pt will improve A/ROM in right wrist flexion/extension from 35/48 to at least 65 degrees each, to have functional motion for tasks like reach and grasp.  Goal status: INITIAL  4.  Pt will improve strength in right wrist flexion/extension from 4 -/5 MMT to at least 4+/5 MMT to have increased functional ability to carry out selfcare and higher-level homecare tasks with less difficulty. Goal status: INITIAL  5.  Pt will decrease pain at worst from 5/10 to 2/10 or better to have better sleep and occupational participation in daily roles. Goal status: INITIAL   ASSESSMENT:  CLINICAL IMPRESSION: Patient is a 23 y.o. female who was seen today for occupational therapy evaluation for stiffness and weakness in her right dominant arm and wrist after dorsal ganglion removal.  The patient will benefit from outpatient occupational therapy to decrease symptoms, improve functional upper extremity use, and increase quality of life.  PERFORMANCE DEFICITS: in functional skills including IADLs, coordination, ROM, strength, pain, fascial restrictions, Gross motor control, endurance, and UE functional use, cognitive skills including problem solving and safety awareness, and psychosocial skills including coping strategies, environmental adaptation, habits, and routines and behaviors.   IMPAIRMENTS: are limiting patient from ADLs, IADLs, rest and sleep, and leisure.   COMORBIDITIES: has no other co-morbidities that affects occupational performance. Patient will benefit from skilled OT to address above impairments and improve overall function.  MODIFICATION OR ASSISTANCE TO COMPLETE EVALUATION: No modification of tasks or assist necessary to complete an evaluation.  OT OCCUPATIONAL PROFILE AND HISTORY: Problem focused assessment: Including review of records relating to presenting problem.  CLINICAL  DECISION MAKING: Moderate - several treatment options, min-mod task modification necessary  REHAB POTENTIAL: Excellent  EVALUATION COMPLEXITY: Low      PLAN:  OT FREQUENCY: 1-2x/week  OT DURATION: 4 weeks through 02/21/2024 and up to 5 total visits as needed   PLANNED INTERVENTIONS: 97535 self care/ADL training, 44010 therapeutic exercise, 97530 therapeutic activity, 97112 neuromuscular re-education, 97140 manual therapy, 97035 ultrasound, [id number] electrical stimulation (manual), 97760 Orthotic Initial, [id number] Orthotic/Prosthetic subsequent, compression bandaging, Dry needling, energy conservation, coping strategies training, and patient/family education  RECOMMENDED OTHER SERVICES: none now    CONSULTED AND AGREED WITH PLAN OF CARE: Patient  PLAN  FOR NEXT SESSION:   Review initial HEP and recommendations, ensure range of motion is improving    Leartis Proud, OTR/L, CHT  01/24/2024, 12:11 PM

## 2024-01-26 ENCOUNTER — Other Ambulatory Visit: Payer: Self-pay | Admitting: Family Medicine

## 2024-01-27 NOTE — Telephone Encounter (Signed)
 Last CPE was on 12/31/22, please schedule CPE and then route back to me, thanks

## 2024-01-28 NOTE — Telephone Encounter (Signed)
 Spoke to pt, sch cpe for 02/12/24

## 2024-02-05 ENCOUNTER — Ambulatory Visit (INDEPENDENT_AMBULATORY_CARE_PROVIDER_SITE_OTHER): Payer: PRIVATE HEALTH INSURANCE | Admitting: Family Medicine

## 2024-02-05 VITALS — BP 104/66 | HR 86 | Temp 98.7°F | Ht 60.0 in | Wt 122.0 lb

## 2024-02-05 DIAGNOSIS — R42 Dizziness and giddiness: Secondary | ICD-10-CM | POA: Diagnosis not present

## 2024-02-05 DIAGNOSIS — I479 Paroxysmal tachycardia, unspecified: Secondary | ICD-10-CM | POA: Diagnosis not present

## 2024-02-05 DIAGNOSIS — Z0000 Encounter for general adult medical examination without abnormal findings: Secondary | ICD-10-CM

## 2024-02-05 DIAGNOSIS — M67431 Ganglion, right wrist: Secondary | ICD-10-CM

## 2024-02-05 NOTE — Assessment & Plan Note (Signed)
Normal HR today.

## 2024-02-05 NOTE — Patient Instructions (Addendum)
 Schedule your routine gyn exam and pap on the way out   Get back to exercise when you are released to   Use sun protection  Take care of yourself   Wellness labs today

## 2024-02-05 NOTE — Progress Notes (Signed)
 Subjective:    Patient ID: Robin Ford, female    DOB: 2001/06/10, 23 y.o.   MRN: [id number]  HPI  Here for health maintenance exam and to review chronic medical problems   Wt Readings from Last 3 Encounters:  02/05/24 122 lb (55.3 kg)  12/25/23 123 lb 0.3 oz (55.8 kg)  10/30/23 124 lb 2 oz (56.3 kg)   23.83 kg/m  Vitals:   02/05/24 1517  BP: 104/66  Pulse: 86  Temp: 98.7 F (37.1 C)  SpO2: 99%    Immunization History  Administered Date(s) Administered   Hepatitis B, PED/ADOLESCENT 05-Jan-2001, 01/24/2001, 10/22/2001   Influenza Inj Mdck Quad Pf 07/29/2020   Influenza, Seasonal, Injecte, Preservative Fre 06/06/2023   Influenza,inj,Quad PF,6+ Mos 07/23/2017, 06/02/2018, 06/06/2019, 07/19/2022   Influenza-Unspecified 05/14/2015, 05/14/2016   Meningococcal Conjugate 03/12/2014   Meningococcal Mcv4o 01/12/2019   Moderna Sars-Covid-2 Vaccination 10/19/2019, 11/16/2019   PPD Test 01/14/2023   Tdap 04/20/2013, 01/16/2023    Health Maintenance Due  Topic Date Due   Meningococcal B Vaccine (1 of 2 - Standard) Never done   Pneumococcal Vaccine 63-47 Years old (1 of 2 - PCV) Never done   Had ganglion cyst surgery recently  In PT to get range of motion back now  Still limited flexion of right wrist   Still working /that is not a problem  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 [employer]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   Self breast exam- no lumps   Gyn health Pap- declines (no time today)   Contraception- OC 0.1-20 daily monophasic  Periods are regular and not heavy or pain  Std screen - declines  Not sexually active right now   HPV vaccines -declines    Bone health   Falls- none    Fractures-none  Supplements -none    Exercise  Walking   Will get back to once recovered from surgery     Mood    02/05/2024    3:23 PM 06/06/2023   12:03 PM 12/31/2022   11:00 AM 12/21/2021   10:32 AM 02/10/2020   10:48 AM  Depression screen PHQ 2/9  Decreased Interest 0  0 0 0 0  Down, Depressed, Hopeless 0 0 0 0 0  PHQ - 2 Score 0 0 0 0 0  Altered sleeping 0 0 1 0   Tired, decreased energy 0 2 0 0   Change in appetite 1 0 0 0   Feeling bad or failure about yourself  0 0 0 0   Trouble concentrating 0 0 0 0   Moving slowly or fidgety/restless 0 0 0 0   Suicidal thoughts 0 0 0 0   PHQ-9 Score 1 2 1  0   Difficult doing work/chores Somewhat difficult Somewhat difficult Not difficult at all Not difficult at all          Patient Active Problem List   Diagnosis Date Noted   Ganglion cyst of dorsum of right wrist 09/27/2023   Ganglion cyst of tendon sheath of right hand 09/24/2023   Urticaria 08/13/2023   Episode of dizziness 06/06/2023   Paroxysmal tachycardia (HCC) 06/06/2023   General counseling and advice on female contraception 10/14/2018   Seborrheic dermatitis of scalp 06/02/2018   Routine general medical examination at a health care facility 03/12/2014   Allergic rhinitis    Past Medical History:  Diagnosis Date   Abnormal EKG    tachycardia   Allergic rhinitis    Asthma  Sore throat    Tachycardia 07/19/2022   90s-100s   Past Surgical History:  Procedure Laterality Date   GANGLION CYST EXCISION Right 12/25/2023   Procedure: EXCISION, GANGLION CYST, WRIST;  Surgeon: Jerri Kay HERO, MD;  Location: [facility];  Service: Orthopedics;  Laterality: Right;   Social History   Tobacco Use   Smoking status: Never   Smokeless tobacco: Never  Vaping Use   Vaping status: Never Used  Substance Use Topics   Alcohol use: No    Alcohol/week: 0.0 standard drinks of alcohol   Drug use: No   Family History  Problem Relation Age of Onset   Asthma Maternal Grandmother    Cancer Maternal Grandfather    COPD Maternal Grandfather    Stroke Maternal Grandfather    Dementia Other    Allergies  Allergen Reactions   Bee Venom Anaphylaxis   Penicillins Hives and Other (See Comments)    Has patient had a PCN reaction causing  immediate rash, facial/tongue/throat swelling, SOB or lightheadedness with hypotension: No Has patient had a PCN reaction causing severe rash involving mucus membranes or skin necrosis: No Has patient had a PCN reaction that required hospitalization No Has patient had a PCN reaction occurring within the last 10 years: Yes If all of the above answers are NO, then may proceed with Cephalosporin use.   Tessalon  [Benzonatate ]     rash   Current Outpatient Medications on File Prior to Visit  Medication Sig Dispense Refill   albuterol  (VENTOLIN  HFA) 108 (90 Base) MCG/ACT inhaler Inhale 1-2 puffs into the lungs every 4 (four) hours as needed for wheezing or shortness of breath. 6.7 each 1   cetirizine  (ZYRTEC ) 10 MG tablet TAKE 1 TABLET BY MOUTH EVERY DAY AS NEEDED FOR ALLERGY 90 tablet 1   EPINEPHrine  0.3 mg/0.3 mL IJ SOAJ injection Inject 0.3 mg into the muscle once as needed (for severe allergic reaction). 1 each 1   VIENVA 0.1-20 MG-MCG tablet TAKE 1 TABLET BY MOUTH EVERY DAY 84 tablet 0   No current facility-administered medications on file prior to visit.    Review of Systems  Constitutional:  Negative for activity change, appetite change, fatigue, fever and unexpected weight change.  HENT:  Negative for congestion, ear pain, rhinorrhea, sinus pressure and sore throat.   Eyes:  Negative for pain, redness and visual disturbance.  Respiratory:  Negative for cough, shortness of breath and wheezing.   Cardiovascular:  Negative for chest pain and palpitations.  Gastrointestinal:  Negative for abdominal pain, blood in stool, constipation and diarrhea.  Endocrine: Negative for polydipsia and polyuria.  Genitourinary:  Negative for dysuria, frequency and urgency.  Musculoskeletal:  Negative for arthralgias, back pain and myalgias.       Right hand limited rom since ganglion surgery  In PT and getting better   Skin:  Negative for pallor and rash.  Allergic/Immunologic: Negative for  environmental allergies.  Neurological:  Negative for dizziness, syncope and headaches.  Hematological:  Negative for adenopathy. Does not bruise/bleed easily.  Psychiatric/Behavioral:  Negative for decreased concentration and dysphoric mood. The patient is not nervous/anxious.        Objective:   Physical Exam Constitutional:      General: She is not in acute distress.    Appearance: Normal appearance. She is well-developed and normal weight. She is not ill-appearing or diaphoretic.  HENT:     Head: Normocephalic and atraumatic.     Right Ear: Tympanic membrane, ear canal and  external ear normal.     Left Ear: Tympanic membrane, ear canal and external ear normal.     Nose: Nose normal. No congestion.     Mouth/Throat:     Mouth: Mucous membranes are moist.     Pharynx: Oropharynx is clear. No posterior oropharyngeal erythema.   Eyes:     General: No scleral icterus.    Extraocular Movements: Extraocular movements intact.     Conjunctiva/sclera: Conjunctivae normal.     Pupils: Pupils are equal, round, and reactive to light.   Neck:     Thyroid : No thyromegaly.     Vascular: No carotid bruit or JVD.   Cardiovascular:     Rate and Rhythm: Normal rate and regular rhythm.     Pulses: Normal pulses.     Heart sounds: Normal heart sounds.     No gallop.  Pulmonary:     Effort: Pulmonary effort is normal. No respiratory distress.     Breath sounds: Normal breath sounds. No wheezing.     Comments: Good air exch Chest:     Chest wall: No tenderness.  Abdominal:     General: Bowel sounds are normal. There is no distension or abdominal bruit.     Palpations: Abdomen is soft. There is no mass.     Tenderness: There is no abdominal tenderness.     Hernia: No hernia is present.  Genitourinary:    Comments: Breast exam: No mass, nodules, thickening, tenderness, bulging, retraction, inflamation, nipple discharge or skin changes noted.  No axillary or clavicular LA.      Declines  gyn exam and pap today Will return a different day for that   Musculoskeletal:        General: No tenderness. Normal range of motion.     Cervical back: Normal range of motion and neck supple. No rigidity. No muscular tenderness.     Right lower leg: No edema.     Left lower leg: No edema.     Comments: No kyphosis   Lymphadenopathy:     Cervical: No cervical adenopathy.   Skin:    General: Skin is warm and dry.     Coloration: Skin is not pale.     Findings: No erythema or rash.     Comments: Few lentigines Olive complexion   Neurological:     Mental Status: She is alert. Mental status is at baseline.     Cranial Nerves: No cranial nerve deficit.     Motor: No abnormal muscle tone.     Coordination: Coordination normal.     Gait: Gait normal.     Deep Tendon Reflexes: Reflexes are normal and symmetric. Reflexes normal.   Psychiatric:        Mood and Affect: Mood normal.        Cognition and Memory: Cognition and memory normal.           Assessment & Plan:   Problem List Items Addressed This Visit       Cardiovascular and Mediastinum   Paroxysmal tachycardia (HCC)   Normal HR today        Other   Routine general medical examination at a health care facility - Primary   Reviewed health habits including diet and exercise and skin cancer prevention Reviewed appropriate screening tests for age  Also reviewed health mt list, fam hx and immunization status , as well as social and family history   See HPI Labs reviewed and ordered Health Maintenance  Topic  Date Due   HPV Vaccine (1 - 3-dose series) Never done   Meningitis B Vaccine (1 of 2 - Standard) Never done   Pneumococcal Vaccination (1 of 2 - PCV) Never done   Pap Smear  Never done   Hepatitis C Screening  02/09/2025*   HIV Screening  02/09/2025*   COVID-19 Vaccine (3 - 2024-25 season) 02/20/2025*   Flu Shot  03/13/2024   DTaP/Tdap/Td vaccine (3 - Td or Tdap) 01/15/2033   Hepatitis B Vaccine  Completed   *Topic was postponed. The date shown is not the original due date.    Pt declines HPV vaccine  Declines STD screening (currently not sexually active) Declines pap due to lack of time today (is due) - will re schedule for gyn exam Wellness labs today  Counseled on use of sun protection  Will continue current OC  PHQ 1        Relevant Orders   TSH   CBC with Differential/Platelet   Comprehensive metabolic panel with GFR   Lipid Panel   Ganglion cyst of dorsum of right wrist   Had surgery- doing well In PT to regain rom       Episode of dizziness   Has only had one episode since last visit (that was due to fasting)

## 2024-02-05 NOTE — Assessment & Plan Note (Signed)
 Had surgery- doing well In PT to regain rom

## 2024-02-05 NOTE — Assessment & Plan Note (Signed)
 Has only had one episode since last visit (that was due to fasting)

## 2024-02-05 NOTE — Assessment & Plan Note (Addendum)
 Reviewed health habits including diet and exercise and skin cancer prevention Reviewed appropriate screening tests for age  Also reviewed health mt list, fam hx and immunization status , as well as social and family history   See HPI Labs reviewed and ordered Health Maintenance  Topic Date Due   HPV Vaccine (1 - 3-dose series) Never done   Meningitis B Vaccine (1 of 2 - Standard) Never done   Pneumococcal Vaccination (1 of 2 - PCV) Never done   Pap Smear  Never done   Hepatitis C Screening  02/09/2025*   HIV Screening  02/09/2025*   COVID-19 Vaccine (3 - 2024-25 season) 02/20/2025*   Flu Shot  03/13/2024   DTaP/Tdap/Td vaccine (3 - Td or Tdap) 01/15/2033   Hepatitis B Vaccine  Completed  *Topic was postponed. The date shown is not the original due date.    Pt declines HPV vaccine  Declines STD screening (currently not sexually active) Declines pap due to lack of time today (is due) - will re schedule for gyn exam Wellness labs today  Counseled on use of sun protection  Will continue current OC  PHQ 1

## 2024-02-06 ENCOUNTER — Ambulatory Visit: Payer: Self-pay | Admitting: Family Medicine

## 2024-02-06 LAB — CBC WITH DIFFERENTIAL/PLATELET
Basophils Absolute: 0 10*3/uL (ref 0.0–0.1)
Basophils Relative: 0.6 % (ref 0.0–3.0)
Eosinophils Absolute: 0.1 10*3/uL (ref 0.0–0.7)
Eosinophils Relative: 0.8 % (ref 0.0–5.0)
HCT: 41.9 % (ref 36.0–46.0)
Hemoglobin: 14.1 g/dL (ref 12.0–15.0)
Lymphocytes Relative: 22.1 % (ref 12.0–46.0)
Lymphs Abs: 1.6 10*3/uL (ref 0.7–4.0)
MCHC: 33.7 g/dL (ref 30.0–36.0)
MCV: 84.8 fl (ref 78.0–100.0)
Monocytes Absolute: 0.4 10*3/uL (ref 0.1–1.0)
Monocytes Relative: 5.8 % (ref 3.0–12.0)
Neutro Abs: 5.2 10*3/uL (ref 1.4–7.7)
Neutrophils Relative %: 70.7 % (ref 43.0–77.0)
Platelets: 211 10*3/uL (ref 150.0–400.0)
RBC: 4.95 Mil/uL (ref 3.87–5.11)
RDW: 13.3 % (ref 11.5–15.5)
WBC: 7.4 10*3/uL (ref 4.0–10.5)

## 2024-02-06 LAB — COMPREHENSIVE METABOLIC PANEL WITH GFR
ALT: 8 U/L (ref 0–35)
AST: 12 U/L (ref 0–37)
Albumin: 4.6 g/dL (ref 3.5–5.2)
Alkaline Phosphatase: 47 U/L (ref 39–117)
BUN: 8 mg/dL (ref 6–23)
CO2: 25 meq/L (ref 19–32)
Calcium: 9.6 mg/dL (ref 8.4–10.5)
Chloride: 103 meq/L (ref 96–112)
Creatinine, Ser: 0.65 mg/dL (ref 0.40–1.20)
GFR: 124.36 mL/min (ref >60.00)
Glucose, Bld: 81 mg/dL (ref 70–99)
Potassium: 3.8 meq/L (ref 3.5–5.1)
Sodium: 138 meq/L (ref 135–145)
Total Bilirubin: 0.6 mg/dL (ref 0.2–1.2)
Total Protein: 6.7 g/dL (ref 6.0–8.3)

## 2024-02-06 LAB — TSH: TSH: 0.71 u[IU]/mL (ref 0.35–5.50)

## 2024-02-06 LAB — LIPID PANEL
Cholesterol: 145 mg/dL (ref 0–200)
HDL: 44.8 mg/dL (ref >39.00)
LDL Cholesterol: 86 mg/dL (ref 0–99)
NonHDL: 100.39
Total CHOL/HDL Ratio: 3
Triglycerides: 72 mg/dL (ref 0.0–149.0)
VLDL: 14.4 mg/dL (ref 0.0–40.0)

## 2024-02-06 NOTE — Therapy (Signed)
 OUTPATIENT OCCUPATIONAL THERAPY TREATMENT NOTE   Patient Name: Robin Ford MRN: [id number] DOB:2001-06-18, 23 y.o., female Today's Date: 02/07/2024  PCP: Randeen Hardy, MD REFERRING PROVIDER: Jule Ronal CROME, PA-C   END OF SESSION:  OT End of Session - 02/07/24 0803     Visit Number 2    Number of Visits 5    Date for OT Re-Evaluation 02/21/24    Authorization Type [employer]    OT Start Time [phone]    OT Stop Time 0847    OT Time Calculation (min) 43 min    Equipment Utilized During Treatment orthotic matierials    Activity Tolerance Patient tolerated treatment well;No increased pain;Patient limited by fatigue;Patient limited by pain    Behavior During Therapy [facility] for tasks assessed/performed           Past Medical History:  Diagnosis Date   Abnormal EKG    tachycardia   Allergic rhinitis    Asthma    Sore throat    Tachycardia 07/19/2022   90s-100s   Past Surgical History:  Procedure Laterality Date   GANGLION CYST EXCISION Right 12/25/2023   Procedure: EXCISION, GANGLION CYST, WRIST;  Surgeon: Jerri Kay HERO, MD;  Location: [facility];  Service: Orthopedics;  Laterality: Right;   Patient Active Problem List   Diagnosis Date Noted   Ganglion cyst of dorsum of right wrist 09/27/2023   Ganglion cyst of tendon sheath of right hand 09/24/2023   Urticaria 08/13/2023   Episode of dizziness 06/06/2023   Paroxysmal tachycardia (HCC) 06/06/2023   General counseling and advice on female contraception 10/14/2018   Seborrheic dermatitis of scalp 06/02/2018   Routine general medical examination at a health care facility 03/12/2014   Allergic rhinitis     ONSET DATE: 12/25/23: DOS  REFERRING DIAG: F32.568 (ICD-10-CM) - Ganglion cyst of dorsum of right wrist   THERAPY DIAG:  Muscle weakness (generalized)  Pain in right wrist  Stiffness of right wrist, not elsewhere classified  Rationale for Evaluation and Treatment: Rehabilitation  PERTINENT  HISTORY: S/p right wrist ganglion cyst excision   PRECAUTIONS: None  RED FLAGS: None   WEIGHT BEARING RESTRICTIONS: No   SUBJECTIVE:   SUBJECTIVE STATEMENT: Now 6 weeks s/p R wrist ganglion cyst removal. She states feeling like she made some progress in the past 2 weeks.  She has not been seen for 2 weeks.  Pain is much decreased now    PAIN:  Are you having pain? Yes: NPRS scale:   0/10 at rest and none significantly  Pain location: Right dorsal wrist just distal to surgical site Pain description: Stiff and sometimes aching Aggravating factors: Wrist flexion or weightbearing Relieving factors: Rest/heat/massage   PATIENT GOALS: To improve pain and stiffness in the right dominant arm and wrist  NEXT MD VISIT: As needed  OBJECTIVE: (All objective assessments below are from initial evaluation on: 01/24/2024 unless otherwise specified.)   HAND DOMINANCE: Right   ADLs: Overall ADLs: States decreased ability to grab, hold household objects, pain and difficulty to open containers   FUNCTIONAL OUTCOME MEASURES: Eval: Patient Specific Functional Scale: 1 (Rotate wrist and driving, push-ups, weight lifting)  (Higher Score  =  Better Ability for the Selected Tasks)      UPPER EXTREMITY ROM     Shoulder to Wrist AROM Right eval Rt 02/07/24  Shoulder flexion    Shoulder abduction    Shoulder extension    Shoulder internal rotation    Shoulder external rotation  Elbow flexion    Elbow extension    Forearm supination    Forearm pronation     Wrist flexion 35  (40 after tx today)  43  Wrist extension 48 72  Wrist ulnar deviation    Wrist radial deviation    Functional dart thrower's motion (F-DTM) in ulnar flexion    F-DTM in radial extension     (Blank rows = not tested)   Hand AROM Right eval  Full Fist Ability (or Gap to Distal Palmar Crease) Forms loose full fist  Thumb Opposition  (Kapandji Scale)  WFL  (Blank rows = not tested)   UPPER EXTREMITY MMT:      MMT Right 01/24/2024 Rt 02/07/24  Shoulder flexion    Shoulder abduction    Shoulder adduction    Shoulder extension    Shoulder internal rotation    Shoulder external rotation    Middle trapezius    Lower trapezius    Elbow flexion 5/5   Elbow extension 5/5   Forearm supination 4 -/5 4-/5  Forearm pronation 4 +/5 4/5  Wrist flexion 4 -/5 4+/5  Wrist extension 4 -/5 4-/5  Wrist ulnar deviation    Wrist radial deviation    (Blank rows = not tested)  HAND FUNCTION: 02/07/24: Grip Rt: 27#and tender/painful in the scar; Lt: 60#   Eval: Observed weakness in affected right hand, as seen by inability to make a tight full fist.  Details TBD as needed  COORDINATION: Eval: Observed coordination impairments with affected right wrist and arm as seen by stiffness and inhibiting pain in the right wrist.  Details TBD as needed  OBSERVATIONS:   Eval: She does appear to have a small suture or stitch coming out of the ulnar side of her surgical area at the dorsal wrist.  OT was able to have her meet with the physicians assistant quickly to have this stitch dealt with.  It was extracted and cut short as best as possible, but a portion is still in her wrist and not totally dissolved.  She was given bacitracin and a Band-Aid to cover this afterward.  She does seem to have some stiffness around the scaphoid and it is somewhat volar today.   TODAY'S TREATMENT:  02/07/24: Active range of motion exercises-year-old better results for motion today, though wrist flexion is still very stiff after 2 weeks of practice.  She states not being able to do joint mobilizations on her own, so OT does manual therapy joint mobilizations with her today yielding some improvements in wrist motion afterwards and some palpable snapping and popping to the tight scaphoid area.  OT educates on therapeutic activities that she can perform at home for joint mobilization using a strap across the wrist and some counterpressure.  OT  also reviews to be performing active wrist flexion with retraction and scar mobilization and also to try active wrist extension while pushing on the scar in a distal direction.  She performs these activities back for understanding and states some tenderness.  OT also reviews her home exercise program and educates on upgraded wrist flexion stretching.  She states understanding and still having some pain and tenderness near the scar area, which is likely inhibiting to her motion.  Her grip strength is significantly weaker in the right hand still, she states mildly painful.   Due to her chronic stiffness and obviously capsular and carpal issues involved, OT decides to start fabricating a custom, dynamic mobilization orthosis.  Unfortunately this is a  very complex orthosis that takes a long time to create, and we did not have time to finish this orthosis today.  It will be finished and modified in the next session, next Wednesday.  During that session we will finish this orthosis as well as train her on its use and ensure that it is not painful to perform.  Try dorsal taping technique until next seen.  PATIENT EDUCATION: Education details: See tx section above for details  Person educated: Patient Education method: Verbal Instruction, Teach back, Handouts  Education comprehension: States and demonstrates understanding, Additional Education required    HOME EXERCISE PROGRAM: Access Code: [email or web address] URL: [email or web address] Date: 01/24/2024 Prepared by: Melvenia Ada   GOALS: Goals reviewed with patient? Yes   SHORT TERM GOALS: (STG required if POC>30 days) Target Date: 01/31/2024  Pt will obtain protective, custom orthotic. Goal status: 02/07/2024: In progress  2.  Pt will demo/state understanding of initial HEP to improve pain levels and prerequisite motion. Goal status: 02/07/24: Met   LONG TERM GOALS: Target Date: 02/21/2024  Pt will improve functional ability by  decreased impairment per PSFS assessment from 1 to 6 or better, for better quality of life. Goal status: INITIAL  2.  Pt will improve grip strength in right hand from TBD lbs to at least 45 lbs for functional use at home and in IADLs. Goal status: INITIAL  3.  Pt will improve A/ROM in right wrist flexion/extension from 35/48 to at least 65 degrees each, to have functional motion for tasks like reach and grasp.  Goal status: INITIAL  4.  Pt will improve strength in right wrist flexion/extension from 4 -/5 MMT to at least 4+/5 MMT to have increased functional ability to carry out selfcare and higher-level homecare tasks with less difficulty. Goal status: INITIAL  5.  Pt will decrease pain at worst from 5/10 to 2/10 or better to have better sleep and occupational participation in daily roles. Goal status: INITIAL   ASSESSMENT:  CLINICAL IMPRESSION: 02/07/24: She made excellent gains in wrist extension passively and actively, which does show her scar as not inhibiting wrist extension on extensor tendons.  For some reason though it is inhibiting her flexion, or she is lacking strength of flexors which is unlikely for her age and issues.  OT is suspicious that pain is inhibiting her motion also some capsular issues.  We have started fabricating a mobilization orthosis, which will be finished in the next session.  PLAN:  OT FREQUENCY: 1-2x/week  OT DURATION: 4 weeks through 02/21/2024 and up to 5 total visits as needed   PLANNED INTERVENTIONS: 97535 self care/ADL training, 02889 therapeutic exercise, 97530 therapeutic activity, 97112 neuromuscular re-education, 97140 manual therapy, 97035 ultrasound, 97032 electrical stimulation (manual), 97760 Orthotic Initial, [id number] Orthotic/Prosthetic subsequent, compression bandaging, Dry needling, energy conservation, coping strategies training, and patient/family education  CONSULTED AND AGREED WITH PLAN OF CARE: Patient  PLAN FOR NEXT SESSION:   Check  motion, finish mobilization orthosis, consider providing wrist flexion strengthening program    Yehoshua Vitelli Ada, OTR/L, CHT  02/07/2024, 10:02 AM

## 2024-02-07 ENCOUNTER — Encounter: Payer: Self-pay | Admitting: Rehabilitative and Restorative Service Providers"

## 2024-02-07 ENCOUNTER — Ambulatory Visit (INDEPENDENT_AMBULATORY_CARE_PROVIDER_SITE_OTHER): Payer: PRIVATE HEALTH INSURANCE | Admitting: Rehabilitative and Restorative Service Providers"

## 2024-02-07 DIAGNOSIS — M25531 Pain in right wrist: Secondary | ICD-10-CM

## 2024-02-07 DIAGNOSIS — M6281 Muscle weakness (generalized): Secondary | ICD-10-CM | POA: Diagnosis not present

## 2024-02-07 DIAGNOSIS — M25631 Stiffness of right wrist, not elsewhere classified: Secondary | ICD-10-CM

## 2024-02-11 NOTE — Therapy (Signed)
 OUTPATIENT OCCUPATIONAL THERAPY TREATMENT NOTE   Patient Name: Robin Ford MRN: [id number] DOB:2001-06-14, 23 y.o., female Today's Date: 02/12/2024  PCP: Randeen Hardy, MD REFERRING PROVIDER: Jule Ronal CROME, PA-C   END OF SESSION:  OT End of Session - 02/12/24 0803     Visit Number 3    Number of Visits 5    Date for OT Re-Evaluation 02/21/24    Authorization Type [employer]    OT Start Time 0803    OT Stop Time 0841    OT Time Calculation (min) 38 min    Equipment Utilized During Treatment orthotic matierials    Activity Tolerance Patient tolerated treatment well;No increased pain;Patient limited by fatigue;Patient limited by pain    Behavior During Therapy [facility] for tasks assessed/performed            Past Medical History:  Diagnosis Date   Abnormal EKG    tachycardia   Allergic rhinitis    Asthma    Sore throat    Tachycardia 07/19/2022   90s-100s   Past Surgical History:  Procedure Laterality Date   GANGLION CYST EXCISION Right 12/25/2023   Procedure: EXCISION, GANGLION CYST, WRIST;  Surgeon: Jerri Kay HERO, MD;  Location: [facility];  Service: Orthopedics;  Laterality: Right;   Patient Active Problem List   Diagnosis Date Noted   Ganglion cyst of dorsum of right wrist 09/27/2023   Ganglion cyst of tendon sheath of right hand 09/24/2023   Urticaria 08/13/2023   Episode of dizziness 06/06/2023   Paroxysmal tachycardia (HCC) 06/06/2023   General counseling and advice on female contraception 10/14/2018   Seborrheic dermatitis of scalp 06/02/2018   Routine general medical examination at a health care facility 03/12/2014   Allergic rhinitis     ONSET DATE: 12/25/23: DOS  REFERRING DIAG: F32.568 (ICD-10-CM) - Ganglion cyst of dorsum of right wrist   THERAPY DIAG:  Muscle weakness (generalized)  Pain in right wrist  Stiffness of right wrist, not elsewhere classified  Rationale for Evaluation and Treatment: Rehabilitation  PERTINENT  HISTORY: S/p right wrist ganglion cyst excision   PRECAUTIONS: None  RED FLAGS: None   WEIGHT BEARING RESTRICTIONS: No   SUBJECTIVE:   SUBJECTIVE STATEMENT: Now 6+ weeks s/p R wrist ganglion cyst removal. She states not really making much progress in the past week.  Not having much pain but having some swelling of the dorsum of her hand at time at work.    PAIN:  Are you having pain? Yes: NPRS scale:    0/10 at rest and none significantly  Pain location: Right dorsal wrist just distal to surgical site Pain description: Stiff and sometimes aching Aggravating factors: Wrist flexion or weightbearing Relieving factors: Rest/heat/massage   PATIENT GOALS: To improve pain and stiffness in the right dominant arm and wrist  NEXT MD VISIT: As needed  OBJECTIVE: (All objective assessments below are from initial evaluation on: 01/24/2024 unless otherwise specified.)   HAND DOMINANCE: Right   ADLs: Overall ADLs: States decreased ability to grab, hold household objects, pain and difficulty to open containers   FUNCTIONAL OUTCOME MEASURES: Eval: Patient Specific Functional Scale: 1 (Rotate wrist and driving, push-ups, weight lifting)  (Higher Score  =  Better Ability for the Selected Tasks)      UPPER EXTREMITY ROM     Shoulder to Wrist AROM Right eval Rt 02/07/24 Rt 02/12/24  Shoulder flexion     Shoulder abduction     Shoulder extension     Shoulder  internal rotation     Shoulder external rotation     Elbow flexion     Elbow extension     Forearm supination     Forearm pronation      Wrist flexion 35  (40 after tx today)  43 35  Wrist extension 48 72 62  Wrist ulnar deviation     Wrist radial deviation     Functional dart thrower's motion (F-DTM) in ulnar flexion     F-DTM in radial extension      (Blank rows = not tested)   Hand AROM Right eval  Full Fist Ability (or Gap to Distal Palmar Crease) Forms loose full fist  Thumb Opposition  (Kapandji Scale)  WFL   (Blank rows = not tested)   UPPER EXTREMITY MMT:     MMT Right 01/24/2024 Rt 02/07/24  Shoulder flexion    Shoulder abduction    Shoulder adduction    Shoulder extension    Shoulder internal rotation    Shoulder external rotation    Middle trapezius    Lower trapezius    Elbow flexion 5/5   Elbow extension 5/5   Forearm supination 4 -/5 4-/5  Forearm pronation 4 +/5 4/5  Wrist flexion 4 -/5 4+/5  Wrist extension 4 -/5 4-/5  Wrist ulnar deviation    Wrist radial deviation    (Blank rows = not tested)  HAND FUNCTION: 02/07/24: Grip Rt: 27#and tender/painful in the scar; Lt: 60#   Eval: Observed weakness in affected right hand, as seen by inability to make a tight full fist.  Details TBD as needed  COORDINATION: Eval: Observed coordination impairments with affected right wrist and arm as seen by stiffness and inhibiting pain in the right wrist.  Details TBD as needed  OBSERVATIONS:   Eval: She does appear to have a small suture or stitch coming out of the ulnar side of her surgical area at the dorsal wrist.  OT was able to have her meet with the physicians assistant quickly to have this stitch dealt with.  It was extracted and cut short as best as possible, but a portion is still in her wrist and not totally dissolved.  She was given bacitracin and a Band-Aid to cover this afterward.  She does seem to have some stiffness around the scaphoid and it is somewhat volar today.   TODAY'S TREATMENT:  02/12/24: Her motion is pretty tight this morning likely because it is early.  OT finishes fabricating her custom dynamic mobilization orthosis and it seems to fit well, gives her a good stretch into flexion at the wrist and fingers.  We discussed using it as quoted below.   Additionally, OT educates on strengthening they can be done for [facility] and wrist extension as well as wrist flexion.  She tolerates these well with some soreness near the scar area which likely represents some adhesions.  OT  does manual therapy cupping again to help with adhesions.  With her wrist flexion and extension motion.  Lastly, she does chair and knee push-ups with OT as she states being afraid of doing this as she has not done it in a long time due to ganglion cyst and surgery.  She has little to no pain with this only some tightness and pulling and was encouraged to do this 2-3 times a day 5-10 reps slowly.  She states understanding all directions and leaves without any significant pain at the end of the session     Exercises -  Standing Radial Nerve Glide  - 4-6 x daily - 1 sets - 10-15 reps - Forearm Pronation Stretch  - 3-4 x daily - 3-5 reps - 15 sec hold - Wrist Flexion Stretch  - 4 x daily - 3-5 reps - 15 sec hold - Wrist Prayer Stretch  - 4 x daily - 3-5 reps - 15 sec hold - Seated Wrist Ulnar Deviation Stretch  - 3-5 x daily - 3-5 reps - 15 sec hold - Seated Wrist Extension with Resistance  - 2-3 x daily - 4-6 x weekly - 10-15 reps - 3 sec hold - Wrist Flexion with Resistance  - 2-3 x daily - 4-6 x weekly - 10-15 reps - 3 sec hold   Dynamic Mobilization Brace   Wear 3-4x day for 30 mins at a time.   Should be light stretch for long time- no pain.    It helps to warmup and do manual stretches first!   Ice down after and take it easy if you're sore.   PATIENT EDUCATION: Education details: See tx section above for details  Person educated: Patient Education method: Verbal Instruction, Teach back, Handouts  Education comprehension: States and demonstrates understanding, Additional Education required    HOME EXERCISE PROGRAM: Access Code: [email or web address] URL: [email or web address] Date: 01/24/2024 Prepared by: Melvenia Ada   GOALS: Goals reviewed with patient? Yes   SHORT TERM GOALS: (STG required if POC>30 days) Target Date: 01/31/2024  Pt will obtain protective, custom orthotic. Goal status: 02/07/2024: In progress  2.  Pt will demo/state understanding of initial  HEP to improve pain levels and prerequisite motion. Goal status: 02/07/24: Met   LONG TERM GOALS: Target Date: 02/21/2024  Pt will improve functional ability by decreased impairment per PSFS assessment from 1 to 6 or better, for better quality of life. Goal status: INITIAL  2.  Pt will improve grip strength in right hand from TBD lbs to at least 45 lbs for functional use at home and in IADLs. Goal status: INITIAL  3.  Pt will improve A/ROM in right wrist flexion/extension from 35/48 to at least 65 degrees each, to have functional motion for tasks like reach and grasp.  Goal status: INITIAL  4.  Pt will improve strength in right wrist flexion/extension from 4 -/5 MMT to at least 4+/5 MMT to have increased functional ability to carry out selfcare and higher-level homecare tasks with less difficulty. Goal status: INITIAL  5.  Pt will decrease pain at worst from 5/10 to 2/10 or better to have better sleep and occupational participation in daily roles. Goal status: INITIAL   ASSESSMENT:  CLINICAL IMPRESSION: 02/12/24: She now has an excellent and robust program, and hopefully this improves her wrist flexion by next week, otherwise this means that it is going to take some time for her.  There is not much else to add to this plan of care however.  02/07/24: She made excellent gains in wrist extension passively and actively, which does show her scar as not inhibiting wrist extension on extensor tendons.  For some reason though it is inhibiting her flexion, or she is lacking strength of flexors which is unlikely for her age and issues.  OT is suspicious that pain is inhibiting her motion also some capsular issues.  We have started fabricating a mobilization orthosis, which will be finished in the next session.  PLAN:  OT FREQUENCY: 1-2x/week  OT DURATION: 4 weeks through 02/21/2024 and up to 5 total visits as needed   PLANNED  INTERVENTIONS: 97535 self care/ADL training, 02889 therapeutic  exercise, 97530 therapeutic activity, 97112 neuromuscular re-education, 97140 manual therapy, 97035 ultrasound, [id number] electrical stimulation (manual), [id number] Orthotic Initial, [id number] Orthotic/Prosthetic subsequent, compression bandaging, Dry needling, energy conservation, coping strategies training, and patient/family education  CONSULTED AND AGREED WITH PLAN OF CARE: Patient  PLAN FOR NEXT SESSION:   Progress note next week, check orthosis, check HEP and determine any need for additional therapy    Melvenia Ada, OTR/L, CHT  02/12/2024, 8:45 AM

## 2024-02-12 ENCOUNTER — Encounter: Payer: Self-pay | Admitting: Rehabilitative and Restorative Service Providers"

## 2024-02-12 ENCOUNTER — Ambulatory Visit (INDEPENDENT_AMBULATORY_CARE_PROVIDER_SITE_OTHER): Payer: PRIVATE HEALTH INSURANCE | Admitting: Rehabilitative and Restorative Service Providers"

## 2024-02-12 ENCOUNTER — Encounter: Payer: PRIVATE HEALTH INSURANCE | Admitting: Family Medicine

## 2024-02-12 DIAGNOSIS — M25531 Pain in right wrist: Secondary | ICD-10-CM

## 2024-02-12 DIAGNOSIS — M25631 Stiffness of right wrist, not elsewhere classified: Secondary | ICD-10-CM | POA: Diagnosis not present

## 2024-02-12 DIAGNOSIS — M6281 Muscle weakness (generalized): Secondary | ICD-10-CM | POA: Diagnosis not present

## 2024-02-20 NOTE — Therapy (Signed)
 OUTPATIENT OCCUPATIONAL THERAPY TREATMENT & discharge NOTE   Patient Name: Robin Ford MRN: [id number] DOB:28-Feb-2001, 23 y.o., female Today's Date: 02/21/2024  PCP: Randeen Hardy, MD REFERRING PROVIDER: Jule Ronal CROME, PA-C                  OCCUPATIONAL THERAPY DISCHARGE SUMMARY  Visits from Start of Care: 4  Current functional level related to goals / functional outcomes: 02/21/24: She is now met 3 out of 5 long-term goals and significantly improved her strength and motion as well.  The dynamic orthosis had a huge effect even though was only worn once a day for 15 minutes.  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  She states understanding all directions and is happy to discharge today  Education / Equipment: Pt has all needed materials and education. Pt understands how to continue on with self-management. See tx notes for more details.   Patient agrees to discharge due to max benefits received from outpatient occupational therapy / hand therapy at this time.   Melvenia Ada, OTR/L, CHT 02/21/24               END OF SESSION:  OT End of Session - 02/21/24 0806     Visit Number 4    Number of Visits 5    Date for OT Re-Evaluation 02/21/24    Authorization Type [employer]    OT Start Time [phone]    OT Stop Time 0847    OT Time Calculation (min) 43 min    Equipment Utilized During Treatment --    Activity Tolerance Patient tolerated treatment well;No increased pain;Patient limited by fatigue    Behavior During Therapy [facility] for tasks assessed/performed             Past Medical History:  Diagnosis Date   Abnormal EKG    tachycardia   Allergic rhinitis    Asthma    Sore throat    Tachycardia 07/19/2022   90s-100s   Past Surgical History:  Procedure Laterality Date   GANGLION CYST EXCISION Right 12/25/2023   Procedure:  EXCISION, GANGLION CYST, WRIST;  Surgeon: Jerri Kay HERO, MD;  Location: [facility];  Service: Orthopedics;  Laterality: Right;   Patient Active Problem List   Diagnosis Date Noted   Ganglion cyst of dorsum of right wrist 09/27/2023   Ganglion cyst of tendon sheath of right hand 09/24/2023   Urticaria 08/13/2023   Episode of dizziness 06/06/2023   Paroxysmal tachycardia (HCC) 06/06/2023   General counseling and advice on female contraception 10/14/2018   Seborrheic dermatitis of scalp 06/02/2018   Routine general medical examination at a health care facility 03/12/2014   Allergic rhinitis     ONSET DATE: 12/25/23: DOS  REFERRING DIAG: F32.568 (ICD-10-CM) - Ganglion cyst of dorsum of right wrist   THERAPY DIAG:  Muscle weakness (generalized)  Pain in right wrist  Stiffness of right wrist, not elsewhere classified  Rationale for Evaluation and Treatment: Rehabilitation  PERTINENT HISTORY: S/p right wrist ganglion cyst excision   PRECAUTIONS: None  RED FLAGS: None   WEIGHT BEARING RESTRICTIONS: No   SUBJECTIVE:   SUBJECTIVE STATEMENT: Now 7+ weeks s/p R wrist ganglion cyst removal. She states feeling like she is doing better though she was only able to wear the dynamic brace once a day for approximately 15 minutes at a time.  Not having significant  pain now or in the past week     PAIN:  Are you having pain? Yes: NPRS scale:     0/10 at rest and none significantly  Pain location: Right dorsal wrist just distal to surgical site Pain description: Stiff and sometimes aching Aggravating factors: Wrist flexion or weightbearing Relieving factors: Rest/heat/massage   PATIENT GOALS: To improve pain and stiffness in the right dominant arm and wrist  NEXT MD VISIT: As needed  OBJECTIVE: (All objective assessments below are from initial evaluation on: 01/24/2024 unless otherwise specified.)   HAND DOMINANCE: Right   ADLs: Overall ADLs: States much improved  functional ability now   FUNCTIONAL OUTCOME MEASURES: 02/21/24: PSFS: 7.6  Eval: Patient Specific Functional Scale: 1 (Rotate wrist and driving, push-ups, weight lifting)  (Higher Score  =  Better Ability for the Selected Tasks)      UPPER EXTREMITY ROM     Shoulder to Wrist AROM Right eval Rt 02/07/24 Rt 02/12/24 Rt 02/21/24  Shoulder flexion      Shoulder abduction      Shoulder extension      Shoulder internal rotation      Shoulder external rotation      Elbow flexion      Elbow extension      Forearm supination      Forearm pronation       Wrist flexion 35  (40 after tx today)  43 35 59  Wrist extension 48 72 62 72  Wrist ulnar deviation      Wrist radial deviation      Functional dart thrower's motion (F-DTM) in ulnar flexion      F-DTM in radial extension       (Blank rows = not tested)   Hand AROM Right eval  Full Fist Ability (or Gap to Distal Palmar Crease) Forms loose full fist  Thumb Opposition  (Kapandji Scale)  WFL  (Blank rows = not tested)   UPPER EXTREMITY MMT:     MMT Right 01/24/2024 Rt 02/07/24 Rt 02/21/24  Shoulder flexion     Shoulder abduction     Shoulder adduction     Shoulder extension     Shoulder internal rotation     Shoulder external rotation     Middle trapezius     Lower trapezius     Elbow flexion 5/5    Elbow extension 5/5    Forearm supination 4 -/5 4-/5 4/5  Forearm pronation 4 +/5 4/5 4+/5  Wrist flexion 4 -/5 4+/5 4+/5  Wrist extension 4 -/5 4-/5 4/5  Wrist ulnar deviation     Wrist radial deviation     (Blank rows = not tested)  HAND FUNCTION: 02/21/24: Grip Rt: 57#   02/07/24: Grip Rt: 27#and tender/painful in the scar; Lt: 60#   Eval: Observed weakness in affected right hand, as seen by inability to make a tight full fist.  Details TBD as needed  COORDINATION: Eval: Observed coordination impairments with affected right wrist and arm as seen by stiffness and inhibiting pain in the right wrist.  Details TBD as  needed  OBSERVATIONS:   Eval: She does appear to have a small suture or stitch coming out of the ulnar side of her surgical area at the dorsal wrist.  OT was able to have her meet with the physicians assistant quickly to have this stitch dealt with.  It was extracted and cut short as best as possible, but a portion is still in her wrist and not  totally dissolved.  She was given bacitracin and a Band-Aid to cover this afterward.  She does seem to have some stiffness around the scaphoid and it is somewhat volar today.   TODAY'S TREATMENT:  02/21/24: Pt performs AROM, gripping, and strength with right wrist hand and arm against therapist's resistance for exercise/activities as well as new measures today. OT also discusses home and functional tasks with the pt and reviews goals.  She has met 3 out of 5 long-term goals and significantly improved on the remaining 2.  We review wear of the dynamic orthosis and this was recommended for the next several weeks until she has full mobility again.  Using the complied data, OT also reviews home exercises and provides updated recommendations and upgrades as below.  These upgrades include new dynamic resistive strengthening for bilateral arms and wrists and hands.  Pt states understanding and tolerates upgrades well.   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  20# tricep push with dynamic rope and wrist twist x15  5# bi reps bil with FA rotations  Radial nerve flossing  15# push  25# pull    I'd continue BIG, dynamic wrist flexion stretches, prayer stretches (bigger version is closed-chain, dynamic), radial nerve glides.  Strengthen biceps with rotation, triceps with a twist, push and pull (watch your wrist for push and consider wrist wrap or workout gloves if going heavy).  Maybe hammer exercise, and wrist flexion and extension (maybe band over  fingers still).   Work from stable, slow and smooth.. Carefully get into dynamic, catching/throwing, medicine ball, burpees, etc.  Expect to get a bit sore, tired when you do something new- be warmed up.!!   PATIENT EDUCATION: Education details: See tx section above for details  Person educated: Patient Education method: Verbal Instruction, Teach back, Handouts  Education comprehension: States and demonstrates understanding   HOME EXERCISE PROGRAM: Access Code: [email or web address] URL: [email or web address] Date: 01/24/2024 Prepared by: Melvenia Ada   GOALS: Goals reviewed with patient? Yes   SHORT TERM GOALS: (STG required if POC>30 days) Target Date: 01/31/2024  Pt will obtain protective, custom orthotic. Goal status: 02/07/2024: In progress  2.  Pt will demo/state understanding of initial HEP to improve pain levels and prerequisite motion. Goal status: 02/07/24: Met   LONG TERM GOALS: Target Date: 02/21/2024  Pt will improve functional ability by decreased impairment per PSFS assessment from 1 to 6 or better, for better quality of life. Goal status: 02/21/24: MET  2.  Pt will improve grip strength in right hand from TBD lbs to at least 45 lbs for functional use at home and in IADLs. Goal status: 02/21/24: MET  3.  Pt will improve A/ROM in right wrist flexion/extension from 35/48 to at least 65 degrees each, to have functional motion for tasks like reach and grasp.  Goal status: 02/21/24: partially met and much improved, will continue dynamic orthosis and stretches   4.  Pt will improve strength in right wrist flexion/extension from 4 -/5 MMT to at least 4+/5 MMT to have increased functional ability to carry out selfcare and higher-level homecare tasks with less difficulty. Goal status: 02/21/24: partially met much improved, will continue to stretch, etc   5.  Pt will decrease pain at worst from 5/10 to 2/10 or better to have better sleep and occupational  participation in daily roles. Goal status: 02/21/24: MET   ASSESSMENT:  CLINICAL IMPRESSION: 02/21/24: She is now met 3 out of 5 long-term goals and significantly improved her strength and motion as well.  The dynamic orthosis had a huge effect even though was only worn once a day for 15 minutes.  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  She states understanding all directions and is happy to discharge today   PLAN:  OT FREQUENCY: Discharge  OT DURATION: Discharge  PLANNED INTERVENTIONS: 97535 self care/ADL training, 02889 therapeutic exercise, 97530 therapeutic activity, 97112 neuromuscular re-education, 97140 manual therapy, 97035 ultrasound, [id number] electrical stimulation (manual), [id number] Orthotic Initial, [id number] Orthotic/Prosthetic subsequent, compression bandaging, Dry needling, energy conservation, coping strategies training, and patient/family education  CONSULTED AND AGREED WITH PLAN OF CARE: Patient  PLAN FOR NEXT SESSION:   N/A/discharge    Melvenia Ada, OTR/L, CHT  02/21/2024, 8:51 AM

## 2024-02-21 ENCOUNTER — Encounter: Payer: Self-pay | Admitting: Rehabilitative and Restorative Service Providers"

## 2024-02-21 ENCOUNTER — Ambulatory Visit (INDEPENDENT_AMBULATORY_CARE_PROVIDER_SITE_OTHER): Payer: PRIVATE HEALTH INSURANCE | Admitting: Rehabilitative and Restorative Service Providers"

## 2024-02-21 DIAGNOSIS — M25631 Stiffness of right wrist, not elsewhere classified: Secondary | ICD-10-CM

## 2024-02-21 DIAGNOSIS — M6281 Muscle weakness (generalized): Secondary | ICD-10-CM | POA: Diagnosis not present

## 2024-02-21 DIAGNOSIS — M25531 Pain in right wrist: Secondary | ICD-10-CM | POA: Diagnosis not present

## 2024-03-12 ENCOUNTER — Encounter (HOSPITAL_BASED_OUTPATIENT_CLINIC_OR_DEPARTMENT_OTHER): Payer: Self-pay

## 2024-03-12 ENCOUNTER — Emergency Department (HOSPITAL_BASED_OUTPATIENT_CLINIC_OR_DEPARTMENT_OTHER)
Admission: EM | Admit: 2024-03-12 | Discharge: 2024-03-12 | Disposition: A | Discharge status: Home / Self Care | Payer: PRIVATE HEALTH INSURANCE | Attending: Emergency Medicine | Admitting: Emergency Medicine

## 2024-03-12 ENCOUNTER — Other Ambulatory Visit: Payer: Self-pay

## 2024-03-12 DIAGNOSIS — T63441A Toxic effect of venom of bees, accidental (unintentional), initial encounter: Secondary | ICD-10-CM | POA: Diagnosis present

## 2024-03-12 DIAGNOSIS — L509 Urticaria, unspecified: Secondary | ICD-10-CM | POA: Insufficient documentation

## 2024-03-12 MED ORDER — METHYLPREDNISOLONE SODIUM SUCC 125 MG IJ SOLR
125.0000 mg | Freq: Once | INTRAMUSCULAR | Status: AC
  Administered 2024-03-12: 125 mg via INTRAVENOUS
  Filled 2024-03-12: qty 2

## 2024-03-12 MED ORDER — DIPHENHYDRAMINE HCL 50 MG/ML IJ SOLN: 25.0000 mg | Freq: Once | INTRAMUSCULAR | Status: DC

## 2024-03-12 MED ORDER — DIPHENHYDRAMINE HCL 50 MG/ML IJ SOLN
50.0000 mg | Freq: Once | INTRAMUSCULAR | Status: AC
  Administered 2024-03-12: 50 mg via INTRAVENOUS
  Filled 2024-03-12: qty 1

## 2024-03-12 MED ORDER — SODIUM CHLORIDE 0.9 % IV BOLUS
1000.0000 mL | Freq: Once | INTRAVENOUS | Status: AC
Start: 2024-03-12 — End: 2024-03-12
  Administered 2024-03-12: 1000 mL via INTRAVENOUS

## 2024-03-12 NOTE — ED Notes (Signed)
 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 Has an EpiPen  for same reason. Got stung 5 times today but has not been stung before so has never had anaphylaxis that she's aware of. No n/v/d, itching at present. No SOB.

## 2024-03-12 NOTE — ED Triage Notes (Signed)
 Pt reports that she was stung by a bunch of bees. Denies any prior issues with bee stings. States that she was stung approx. 4 times. Pt noted to be red in the face and was stung left foot, right thigh, and right thigh. Eyes are blood shot and appears flushed.  Denies having any difficulty breathing.

## 2024-03-12 NOTE — ED Provider Notes (Signed)
 [place] [facility] AT [facility] [place] Provider Note   CSN: [id number] Arrival date & time: 03/12/24  1706     Patient presents with: Insect Bite   Robin Ford is a 23 y.o. female with past medical history of allergic rhinitis presents to emergency department for evaluation of urticaria following being stung by 6 bees on left ankle, right hand, right thigh.  Rashes are pruritic.  Reports that she sought ED evaluation as she started feeling flushed, hot, dizzy and lightheaded.  Does have an EpiPen  but did not use it.  Denies ever feeling shortness of breath, throat closing sensation, nor chest pain.   HPI     Prior to Admission medications   Medication Sig Start Date End Date Taking? Authorizing Provider  albuterol  (VENTOLIN  HFA) 108 (90 Base) MCG/ACT inhaler Inhale 1-2 puffs into the lungs every 4 (four) hours as needed for wheezing or shortness of breath. 03/15/23   Tower, Laine LABOR, MD  cetirizine  (ZYRTEC ) 10 MG tablet TAKE 1 TABLET BY MOUTH EVERY DAY AS NEEDED FOR ALLERGY 10/30/23   Tower, Laine LABOR, MD  EPINEPHrine  0.3 mg/0.3 mL IJ SOAJ injection Inject 0.3 mg into the muscle once as needed (for severe allergic reaction). 08/13/23   Tower, Laine LABOR, MD  VIENVA 0.1-20 MG-MCG tablet TAKE 1 TABLET BY MOUTH EVERY DAY 01/28/24   Tower, Laine LABOR, MD    Allergies: Bee venom, Penicillins, and Tessalon  [benzonatate ]    Review of Systems  Skin:  Positive for rash.    Updated Vital Signs BP 126/75   Pulse (!) 111   Temp (!) 97.3 F (36.3 C) (Oral)   Resp 16   Ht 5' (1.524 m)   Wt 56.7 kg   LMP 02/26/2024   SpO2 100%   BMI 24.41 kg/m   Physical Exam Vitals and nursing note reviewed.  Constitutional:      General: She is not in acute distress.    Appearance: Normal appearance. She is not ill-appearing.  HENT:     Head: Normocephalic and atraumatic.     Mouth/Throat:     Pharynx: Uvula midline. No oropharyngeal exudate, posterior oropharyngeal erythema or uvula  swelling.     Tonsils: No tonsillar exudate or tonsillar abscesses.     Comments: Maintain secretions without difficulty.  Swallows without difficulty Eyes:     Conjunctiva/sclera: Conjunctivae normal.  Cardiovascular:     Rate and Rhythm: Normal rate.  Pulmonary:     Effort: Pulmonary effort is normal. No respiratory distress.     Breath sounds: Normal breath sounds.     Comments: Speaking full complete sentences without difficulty.  Maintaining oxygen saturations without supplementation.  No signs of physical respiratory distress Skin:    Coloration: Skin is not jaundiced or pale.     Comments: Urticaria to left ankle, right hand, right distal thigh that are reported to be pruritic.  Neurological:     Mental Status: She is alert. Mental status is at baseline.     (all labs ordered are listed, but only abnormal results are displayed) Labs Reviewed - No data to display  EKG: None  Radiology: No results found.    Medications Ordered in the ED  sodium chloride  0.9 % bolus 1,000 mL (0 mLs Intravenous Stopped 03/12/24 1928)  methylPREDNISolone  sodium succinate (SOLU-MEDROL ) 125 mg/2 mL injection 125 mg (125 mg Intravenous Given 03/12/24 1855)  diphenhydrAMINE  (BENADRYL ) injection 50 mg (50 mg Intravenous Given 03/12/24 1856)  Medical Decision Making Risk Prescription drug management.   Patient presents to the ED for concern of urticaria, this involves an extensive number of treatment options, and is a complaint that carries with it a high risk of complications and morbidity.  The differential diagnosis includes allergic reaction, cellulitis, psoriasis, anaphylaxis   Co morbidities that complicate the patient evaluation  Past medical history of allergic reaction   Additional history obtained:  Additional history obtained from Nursing   External records from outside source obtained and reviewed including triage note     Cardiac  Monitoring:  The patient was maintained on a cardiac monitor.     Medicines ordered and prescription drug management:  I ordered medication including Benadryl , Solu-Medrol  for allergic reaction Reevaluation of the patient after these medicines showed that the patient improved I have reviewed the patients home medicines and have made adjustments as needed    Problem List / ED Course:  Urticaria Allergic reaction Does have multiple areas of surrounding urticaria around locations of bee sting Hemodynamically stable with no hypotension No complaints of chest pain, shortness of breath, throat closing sensation.  Low suspicion of anaphylaxis Low suspicion for infection with no drainage and noninfectious appearing. No fever Provided Benadryl , Solu-Medrol  for suspected allergic reaction Observed patient 3 hours post bee sting with no complaints of chest pain, shortness of breath, throat closing sensation.  Urticaria improved following Benadryl , Solu-Medrol  Maintains action saturation without supplementation.  No physical signs of respiratory distress nor tachypnea.  Lung sounds CTAB with no wheezing Does have EpiPen  that is within the date that she received this year.  Do not need to prescribe another EpiPen .  She did not have EpiPen  with her.  I discussed the importance of having her EpiPen  with her at all times as she has history of allergic reaction and could possibly have anaphylaxis Does have allergy specialist follow-up Patient's family will remain with patient to monitor her Discussed strict return precautions to include signs of anaphylaxis, anaphylactic shock with patient verbally and provided on DC paperwork   Reevaluation:  After the interventions noted above, I reevaluated the patient and found that they have :improved   Social Determinants of Health:  Has PCP   Dispostion:  After consideration of the diagnostic results and the patients response to treatment, I feel  that the patent would benefit from outpatient management with PCP follow-up.   Discussed ED workup, disposition, return to ED precautions with patient who expresses understanding agrees with plan.  All questions answered to their satisfaction.  They are agreeable to plan.  Discharge instructions provided on paperwork  Final diagnoses:  Urticaria  Bee sting, accidental or unintentional, initial encounter    ED Discharge Orders     None        Minnie Tinnie BRAVO, PA 03/17/24 0051    Towana Ozell BROCKS, MD 03/17/24 [phone]

## 2024-03-12 NOTE — ED Notes (Signed)
 Pt in NAD, sitting upright in the bed. No active SOB, skin warm and dry, no tremor.

## 2024-03-12 NOTE — Discharge Instructions (Signed)
 Thank you for letting us  evaluate you today.  We have evaluated you 3 hours following sting.  It appears that you have a urticaria/skin reaction to bee sting.  Please avoid bees.  We have given you Benadryl , Solu-Medrol  for symptoms with some relief.  You may take 25mg -50mg  Benadryl  every 6 hours as needed.  Please return to emergency department if you experience throat closing sensation, shortness of breath, worsening symptoms

## 2024-03-12 NOTE — ED Notes (Signed)
 Pt feeling much better. No current complaint aside from feeling tired.

## 2024-03-12 NOTE — ED Notes (Signed)

## 2024-03-13 ENCOUNTER — Ambulatory Visit: Payer: PRIVATE HEALTH INSURANCE | Admitting: Family Medicine

## 2024-03-17 ENCOUNTER — Ambulatory Visit: Payer: Self-pay

## 2024-03-17 NOTE — Telephone Encounter (Signed)
 Needs an in person visit for a re check with first available  Thanks   Agree with ER precautions

## 2024-03-17 NOTE — Telephone Encounter (Signed)
 See pt's request regarding meds. Pt did go to UC on 03/12/24 for this issue. Pt has a GYN f/u with PCP on 03/20/24

## 2024-03-17 NOTE — Telephone Encounter (Signed)
 FYI Only or Action Required?: Action required by provider: requesting medication for rash, deosnt want to be seen.  Patient was last seen in primary care on 02/05/2024 by Randeen Laine LABOR, MD.  Called Nurse Triage reporting Insect Bite.  Symptoms began several days ago.  Interventions attempted: OTC medications: benadryl .  Symptoms are: unchanged.  Triage Disposition: See Physician Within 24 Hours  Patient/caregiver understands and will follow disposition?: Unsure              Copied from CRM #[id number]. Topic: Clinical - Medication Question >> Mar 17, 2024  1:17 PM Mesmerise C wrote: Reason for CRM: Patient is wanting a medication to be prescribed amoxicillin  or a steroid for a rash she developed from an allergic reaction she had with bee stings went to [facility] and gave her a meds to help for it but the benadryl  isn't working Reason for Disposition  SEVERE itching (i.e., interferes with sleep, normal activities or school)  Answer Assessment - Initial Assessment Questions 1. APPEARANCE of RASH: What does the rash look like? (e.g., blisters, dry flaky skin, red spots, redness, sores)     Just a rash  red 2. SIZE: How big are the spots? (e.g., tip of pen, eraser, coin; inches, centimeters)     Hand area is like an airpod case 3. LOCATION: Where is the rash located?    Right hand and left ankle 4. COLOR: What color is the rash? (Note: It is difficult to assess rash color in people with darker-colored skin. When this situation occurs, simply ask the caller to describe what they see.)     red 5. ONSET: When did the rash begin?     Last Thursday  6. FEVER: Do you have a fever? If Yes, ask: What is your temperature, how was it measured, and when did it start?     no 7. ITCHING: Does the rash itch? If Yes, ask: How bad is the itch? (Scale 1-10; or mild, moderate, severe)     3/10 8. CAUSE: What do you think is causing the rash?     Bee stings last week 9.  MEDICINE FACTORS: Have you started any new medicines within the last 2 weeks? (e.g., antibiotics)      no 10. OTHER SYMPTOMS: Do you have any other symptoms? (e.g., dizziness, headache, sore throat, joint pain)       no  Protocols used: Rash or Redness - [facility]

## 2024-03-18 NOTE — Telephone Encounter (Signed)
 Called patient she stated she will just see Dr Randeen on Friday .

## 2024-03-20 ENCOUNTER — Ambulatory Visit (INDEPENDENT_AMBULATORY_CARE_PROVIDER_SITE_OTHER): Payer: PRIVATE HEALTH INSURANCE | Admitting: Family Medicine

## 2024-03-20 ENCOUNTER — Encounter: Payer: Self-pay | Admitting: Family Medicine

## 2024-03-20 ENCOUNTER — Other Ambulatory Visit (HOSPITAL_COMMUNITY)
Admission: RE | Admit: 2024-03-20 | Discharge: 2024-03-20 | Disposition: A | Discharge status: Home / Self Care | Source: Ambulatory Visit | Attending: Family Medicine | Admitting: Family Medicine

## 2024-03-20 VITALS — BP 116/62 | HR 83 | Temp 98.4°F | Ht 60.0 in | Wt 119.0 lb

## 2024-03-20 DIAGNOSIS — Z01419 Encounter for gynecological examination (general) (routine) without abnormal findings: Secondary | ICD-10-CM | POA: Diagnosis present

## 2024-03-20 DIAGNOSIS — T63461D Toxic effect of venom of wasps, accidental (unintentional), subsequent encounter: Secondary | ICD-10-CM | POA: Diagnosis not present

## 2024-03-20 DIAGNOSIS — T63441S Toxic effect of venom of bees, accidental (unintentional), sequela: Secondary | ICD-10-CM

## 2024-03-20 DIAGNOSIS — T63441A Toxic effect of venom of bees, accidental (unintentional), initial encounter: Secondary | ICD-10-CM | POA: Insufficient documentation

## 2024-03-20 DIAGNOSIS — Z124 Encounter for screening for malignant neoplasm of cervix: Secondary | ICD-10-CM

## 2024-03-20 MED ORDER — ALBUTEROL SULFATE HFA 108 (90 BASE) MCG/ACT IN AERS: 1.0000 | INHALATION_SPRAY | RESPIRATORY_TRACT | 3 refills | Status: AC | PRN

## 2024-03-20 MED ORDER — LEVONORGESTREL-ETHINYL ESTRAD 0.1-20 MG-MCG PO TABS: 1.0000 | ORAL_TABLET | Freq: Every day | ORAL | 3 refills | Status: AC

## 2024-03-20 NOTE — Progress Notes (Signed)
 Subjective:    Patient ID: Robin Ford, female    DOB: [birth year], 23 y.o.   MRN: [id number]  HPI  Wt Readings from Last 3 Encounters:  03/20/24 119 lb (54 kg)  03/12/24 125 lb (56.7 kg)  02/05/24 122 lb (55.3 kg)   23.24 kg/m  Vitals:   03/20/24 0822  BP: 116/62  Pulse: 83  Temp: 98.4 F (36.9 C)  SpO2: 100%   Pt presents for gyn exam  Also for follow up of bee stings (went to UC)and was given methylprenisolone and diphenhydramine    Was stung by wasps - 5 stings- on hand and ankle  Allergic to bees ?   Keeps epi pen usually but did not have that day Eyes turned red along with skin  Her pulse went up high / and she felt shocky  Felt better in ER Never had mouth or throat swelling   Back of right hand still itches when she gets hot    Has declined HPV vaccine in past  STD screening   Due  for pap   OC 0.1-20 mg vienva  Menses -regular  Not heavy  Last 4-5 days  Not sexually active now but has been in the past      Patient Active Problem List   Diagnosis Date Noted   Visit for routine gyn exam 03/20/2024   Sting from hornet, wasp, or bee 03/20/2024   Ganglion cyst of dorsum of right wrist 09/27/2023   Ganglion cyst of tendon sheath of right hand 09/24/2023   Urticaria 08/13/2023   Episode of dizziness 06/06/2023   Paroxysmal tachycardia (HCC) 06/06/2023   General counseling and advice on female contraception 10/14/2018   Seborrheic dermatitis of scalp 06/02/2018   Routine general medical examination at a health care facility 03/12/2014   Allergic rhinitis    Past Medical History:  Diagnosis Date   Abnormal EKG    tachycardia   Allergic rhinitis    Asthma    Sore throat    Tachycardia 07/19/2022   90s-100s   Past Surgical History:  Procedure Laterality Date   GANGLION CYST EXCISION Right 12/25/2023   Procedure: EXCISION, GANGLION CYST, WRIST;  Surgeon: Jerri Kay HERO, MD;  Location: [facility];  Service: Orthopedics;  Laterality:  Right;   Social History   Tobacco Use   Smoking status: Never   Smokeless tobacco: Never  Vaping Use   Vaping status: Never Used  Substance Use Topics   Alcohol use: No    Alcohol/week: 0.0 standard drinks of alcohol   Drug use: No   Family History  Problem Relation Age of Onset   Asthma Maternal Grandmother    Cancer Maternal Grandfather    COPD Maternal Grandfather    Stroke Maternal Grandfather    Dementia Other    Allergies  Allergen Reactions   Bee Venom Anaphylaxis   Penicillins Hives and Other (See Comments)    Has patient had a PCN reaction causing immediate rash, facial/tongue/throat swelling, SOB or lightheadedness with hypotension: No Has patient had a PCN reaction causing severe rash involving mucus membranes or skin necrosis: No Has patient had a PCN reaction that required hospitalization No Has patient had a PCN reaction occurring within the last 10 years: Yes If all of the above answers are NO, then may proceed with Cephalosporin use.   Tessalon  [Benzonatate ]     rash   Current Outpatient Medications on File Prior to Visit  Medication Sig Dispense Refill  cetirizine  (ZYRTEC ) 10 MG tablet TAKE 1 TABLET BY MOUTH EVERY DAY AS NEEDED FOR ALLERGY 90 tablet 1   EPINEPHrine  0.3 mg/0.3 mL IJ SOAJ injection Inject 0.3 mg into the muscle once as needed (for severe allergic reaction). 1 each 1   No current facility-administered medications on file prior to visit.    Review of Systems  Constitutional:  Negative for fatigue and fever.  Respiratory:  Negative for shortness of breath and wheezing.   Genitourinary:  Negative for flank pain, frequency, genital sores, menstrual problem and pelvic pain.  Skin:        Stings Right hand  Left ankle        Objective:   Physical Exam Constitutional:      General: She is not in acute distress.    Appearance: Normal appearance. She is normal weight. She is not ill-appearing.  Cardiovascular:     Rate and Rhythm:  Normal rate and regular rhythm.  Pulmonary:     Effort: Pulmonary effort is normal. No respiratory distress.  Genitourinary:    Comments: Breast exam: No mass, nodules, thickening, tenderness, bulging, retraction, inflamation, nipple discharge or skin changes noted.  No axillary or clavicular LA.                  Anus appears normal w/o hemorrhoids or masses       External genitalia : nl appearance and hair distribution/no lesions       Urethral meatus : nl size, no lesions or prolapse       Urethra: no masses, tenderness or scarring      Bladder : no masses or tenderness       Vagina: nl general appearance, no discharge or  Lesions, no significant cystocele  or rectocele       Cervix: no lesions/ discharge or friability (scant brown blood at os-starting menses)      Uterus: nl size, contour, position, and mobility (not fixed) , non tender      Adnexa : no masses, tenderness, enlargement or nodularity          Skin:    Coloration: Skin is not jaundiced or pale.     Findings: No bruising.     Comments: Sting areas (right dorsal hand and left ankle) are pink without swelling , excoriation or open areas   Neurological:     Mental Status: She is alert.  Psychiatric:        Mood and Affect: Mood normal.           Assessment & Plan:   Problem List Items Addressed This Visit       Other   Visit for routine gyn exam - Primary   First exam /pap Declines HPV vaccine- may consider later Not currently sexually active   Starting menses today-slight spotting  Pap sent   Instructed to continue current 0.1-20 OC daily  Encouraged safe sexual practices      Relevant Orders   Cytology - PAP([place])   Sting from hornet, wasp, or bee   Reviewed UC notes  Local rxn is improved  Encouraged use of cold compress for itch- right hand left ankle  Has epi pen Did not have any anaphylaxis fortunately

## 2024-03-20 NOTE — Patient Instructions (Signed)
 We will reach out with pap results Continue your current oral contraceptive   Put cool compress on stings for itching Let us  know if that does not improve

## 2024-03-20 NOTE — Assessment & Plan Note (Signed)
 First exam /pap Declines HPV vaccine- may consider later Not currently sexually active   Starting menses today-slight spotting  Pap sent   Instructed to continue current 0.1-20 OC daily  Encouraged safe sexual practices

## 2024-03-20 NOTE — Assessment & Plan Note (Signed)
 Reviewed UC notes  Local rxn is improved  Encouraged use of cold compress for itch- right hand left ankle  Has epi pen Did not have any anaphylaxis fortunately

## 2024-03-26 ENCOUNTER — Ambulatory Visit: Payer: Self-pay | Admitting: Family Medicine

## 2024-03-26 DIAGNOSIS — R8789 Other abnormal findings in specimens from female genital organs: Secondary | ICD-10-CM

## 2024-03-26 LAB — CYTOLOGY - PAP
Chlamydia: NEGATIVE
Comment: NEGATIVE
Comment: NEGATIVE
Comment: NEGATIVE
Comment: NEGATIVE
Comment: NORMAL
HPV 16: NEGATIVE
HPV 18 / 45: NEGATIVE
High risk HPV: POSITIVE — AB
Neisseria Gonorrhea: NEGATIVE

## 2024-03-27 DIAGNOSIS — R8789 Other abnormal findings in specimens from female genital organs: Secondary | ICD-10-CM | POA: Insufficient documentation

## 2024-03-27 NOTE — Telephone Encounter (Signed)
 I put the referral in for GYN Please let us  know if you don't hear in 1-2 weeks to set that up (mychart message or call or letter)

## 2024-04-21 ENCOUNTER — Other Ambulatory Visit: Payer: Self-pay | Admitting: Family Medicine

## 2024-05-10 ENCOUNTER — Encounter: Payer: Self-pay | Admitting: Emergency Medicine

## 2024-05-10 ENCOUNTER — Ambulatory Visit
Admission: EM | Admit: 2024-05-10 | Discharge: 2024-05-10 | Disposition: A | Discharge status: Home / Self Care | Payer: PRIVATE HEALTH INSURANCE | Attending: Emergency Medicine | Admitting: Emergency Medicine

## 2024-05-10 DIAGNOSIS — S90569A Insect bite (nonvenomous), unspecified ankle, initial encounter: Secondary | ICD-10-CM

## 2024-05-10 DIAGNOSIS — W57XXXA Bitten or stung by nonvenomous insect and other nonvenomous arthropods, initial encounter: Secondary | ICD-10-CM | POA: Diagnosis not present

## 2024-05-10 DIAGNOSIS — S91331A Puncture wound without foreign body, right foot, initial encounter: Secondary | ICD-10-CM | POA: Diagnosis not present

## 2024-05-10 MED ORDER — PREDNISONE 10 MG (21) PO TBPK: ORAL_TABLET | Freq: Every day | ORAL | 0 refills | Status: DC

## 2024-05-10 NOTE — Discharge Instructions (Signed)
 Today you are evaluated for the redness and swelling to your foot which appears to be a localized reaction to what ever has bitten you, at this time does not appear to be infected  Begin prednisone  every morning with food as directed to reduce inflammation and help with pain, avoid ibuprofen  while taking but may use Tylenol   May elevate whenever sitting and lying to help reduce swelling  May apply ice over the affected area 10 to 15-minute intervals  May continue use of Benadryl  cream, calamine lotion, hydrocortisone  or any further topical medicines to help with any itching or pain  If symptoms continue to persist please follow-up for reevaluation  At any point if you begin to see worsening redness, worsening swelling or pain or drainage please follow-up for reevaluation as these are signs of infection

## 2024-05-10 NOTE — ED Provider Notes (Signed)
 [name]    CSN: [id number] Arrival date & time: 05/10/24  1001      History   Chief Complaint Chief Complaint  Patient presents with   Insect Bite    HPI Robin Ford is a 23 y.o. female.   Patient presents for evaluation of erythema, swelling and tenderness present to the right foot after insect bite that occurred 1 day ago.,  Insect unwitnessed but endorses she was standing near an ant heel.  Had a red flash initially prior to symptoms beginning, resolved spontaneously.  Denies any respiratory involvement.  Has applied Benadryl  cream with no improvement.  Denies drainage or fever.  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 03/27/2024   Visit for routine gyn exam 03/20/2024   Sting from hornet, wasp, or bee 03/20/2024   Ganglion cyst of dorsum of right wrist 09/27/2023   Ganglion cyst of tendon sheath of right hand 09/24/2023   Urticaria 08/13/2023   Episode of dizziness 06/06/2023   Paroxysmal tachycardia (HCC) 06/06/2023   General counseling and advice on female contraception 10/14/2018   Seborrheic dermatitis of scalp 06/02/2018   Routine general medical examination at a health care facility 03/12/2014   Allergic rhinitis     Past Surgical History:  Procedure Laterality Date   GANGLION CYST EXCISION Right 12/25/2023   Procedure: EXCISION, GANGLION CYST, WRIST;  Surgeon: Jerri Kay HERO, MD;  Location: [facility];  Service: Orthopedics;  Laterality: Right;    OB History   No obstetric history on file.      Home Medications    Prior to Admission medications   Medication Sig Start Date End Date Taking? Authorizing Provider  predniSONE  (STERAPRED UNI-PAK  21 TAB) 10 MG (21) TBPK tablet Take by mouth daily. Take 6 tabs by mouth daily  for 1 days, then 5 tabs for 1 days, then 4 tabs for 1 days, then 3 tabs for 1 days, 2 tabs for 1 days, then 1 tab by mouth daily for 1 days 05/10/24  Yes Britlee Skolnik R, NP  albuterol  (VENTOLIN  HFA) 108 (90 Base) MCG/ACT inhaler Inhale 1-2 puffs into the lungs every 4 (four) hours as needed for wheezing or shortness of breath. 03/20/24   Tower, Laine LABOR, MD  cetirizine  (ZYRTEC ) 10 MG tablet TAKE 1 TABLET BY MOUTH EVERY DAY AS NEEDED FOR ALLERGY 04/21/24   Tower, Laine LABOR, MD  EPINEPHrine  0.3 mg/0.3 mL IJ SOAJ injection Inject 0.3 mg into the muscle once as needed (for severe allergic reaction). 08/13/23   Tower, Laine LABOR, MD  levonorgestrel -ethinyl estradiol (VIENVA) 0.1-20 MG-MCG tablet Take 1 tablet by mouth daily. 03/20/24   Tower, Laine LABOR, MD    Family History Family History  Problem Relation Age of Onset   Asthma Maternal Grandmother    Cancer Maternal Grandfather    COPD Maternal Grandfather    Stroke Maternal Grandfather    Dementia Other     Social History Social History   Tobacco Use   Smoking status: Never   Smokeless tobacco: Never  Vaping Use   Vaping status: Never Used  Substance Use Topics   Alcohol use:  No    Alcohol/week: 0.0 standard drinks of alcohol   Drug use: No     Allergies   Bee venom, Penicillins, and Tessalon  [benzonatate ]   Review of Systems Review of Systems   Physical Exam Triage Vital Signs ED Triage Vitals  Encounter Vitals Group     BP 05/10/24 1021 111/73     Girls Systolic BP Percentile --      Girls Diastolic BP Percentile --      Boys Systolic BP Percentile --      Boys Diastolic BP Percentile --      Pulse Rate 05/10/24 1021 85     Resp 05/10/24 1021 15     Temp 05/10/24 1021 98.6 F (37 C)     Temp Source 05/10/24 1021 Oral     SpO2 05/10/24 1021 99 %     Weight --      Height --      Head Circumference --      Peak Flow --      Pain Score 05/10/24  1033 0     Pain Loc --      Pain Education --      Exclude from Growth Chart --    No data found.  Updated Vital Signs BP 111/73 (BP Location: Right Arm)   Pulse 85   Temp 98.6 F (37 C) (Oral)   Resp 15   LMP 04/22/2024 (Approximate)   SpO2 99%   Visual Acuity Right Eye Distance:   Left Eye Distance:   Bilateral Distance:    Right Eye Near:   Left Eye Near:    Bilateral Near:     Physical Exam Constitutional:      Appearance: Normal appearance.  Eyes:     Extraocular Movements: Extraocular movements intact.  Pulmonary:     Effort: Pulmonary effort is normal.  Neurological:     Mental Status: She is alert and oriented to person, place, and time.      UC Treatments / Results  Labs (all labs ordered are listed, but only abnormal results are displayed) Labs Reviewed - No data to display  EKG   Radiology No results found.  Procedures Procedures (including critical care time)  Medications Ordered in UC Medications - No data to display  Initial Impression / Assessment and Plan / UC Course  I have reviewed the triage vital signs and the nursing notes.  Pertinent labs & imaging results that were available during my care of the patient were reviewed by me and considered in my medical decision making (see chart for details).  Puncture wound of the right foot, insect bite of ankle with local reaction  Appears to be localized reaction, no signs of infection at this time, discussed with patient and family, prescribed prednisone  and discussed administration, recommended over-the-counter medication and nonpharmacological measures for management of pain and pruritus, advised to monitor for signs of infection and to return for reevaluation if it occurs and advised to follow-up if symptoms continue to persist or worsen Final Clinical Impressions(s) / UC Diagnoses   Final diagnoses:  Insect bite of abdomen with local reaction, initial encounter  Puncture wound of right  foot, initial encounter     Discharge Instructions      Today you are evaluated for the redness and swelling to your foot which appears to be a localized reaction to what ever has bitten you, at this time does not appear to be infected  Begin prednisone  every morning with food  as directed to reduce inflammation and help with pain, avoid ibuprofen  while taking but may use Tylenol   May elevate whenever sitting and lying to help reduce swelling  May apply ice over the affected area 10 to 15-minute intervals  May continue use of Benadryl  cream, calamine lotion, hydrocortisone  or any further topical medicines to help with any itching or pain  If symptoms continue to persist please follow-up for reevaluation  At any point if you begin to see worsening redness, worsening swelling or pain or drainage please follow-up for reevaluation as these are signs of infection   ED Prescriptions     Medication Sig Dispense Auth. Provider   predniSONE  (STERAPRED UNI-PAK 21 TAB) 10 MG (21) TBPK tablet Take by mouth daily. Take 6 tabs by mouth daily  for 1 days, then 5 tabs for 1 days, then 4 tabs for 1 days, then 3 tabs for 1 days, 2 tabs for 1 days, then 1 tab by mouth daily for 1 days 21 tablet Destony Prevost, Shelba SAUNDERS, NP      PDMP not reviewed this encounter.   Teresa Shelba SAUNDERS, NP 05/10/24 1056

## 2024-05-10 NOTE — ED Triage Notes (Signed)
 Patient reports insect bite to right foot  yesterday at 3 pm. Patient complains of redness, itching and swelling to right foot.

## 2024-05-22 ENCOUNTER — Encounter: Payer: Self-pay | Admitting: Family Medicine

## 2024-05-22 ENCOUNTER — Telehealth: Payer: PRIVATE HEALTH INSURANCE | Admitting: Family Medicine

## 2024-05-22 VITALS — Ht 60.0 in | Wt 120.0 lb

## 2024-05-22 DIAGNOSIS — L309 Dermatitis, unspecified: Secondary | ICD-10-CM

## 2024-05-22 MED ORDER — HYDROCORTISONE 1 % EX CREA: 1.0000 | TOPICAL_CREAM | Freq: Two times a day (BID) | CUTANEOUS | 0 refills | Status: AC

## 2024-05-22 NOTE — Progress Notes (Signed)
 Virtual Visit via Video Note  I connected with Robin Ford on 05/22/24 at  8:30 AM EDT by a video enabled telemedicine application and verified that I am speaking with the correct person using two identifiers.  Patient Location: Home Provider Location: Office/Clinic  I discussed the limitations, risks, security, and privacy concerns of performing an evaluation and management service by video and the availability of in person appointments. I also discussed with the patient that there may be a patient responsible charge related to this service. The patient expressed understanding and agreed to proceed.  Parties involved in encounter  Patient: Robin Ford  Provider:  Laine Balls MD   Subjective: PCP: Balls Laine LABOR, MD  Chief Complaint  Patient presents with   Rash    On face   HPI Pt presents with facial rash  Has a rash around her mouth  Dry skin and redness  Comes and goes  For 2 weeks   No pimples  No pustules  It does itch   No new foods   Used to wash face with water only   Started using [employer] Roche facial cleanser and moisturizer Did not help   Has not been in the sun   Seldom gets acne/just around period   Has cetaphil moisturizer with sunscreen (not used yet)- fragrance free  Has not been out in the sun   No history of eczema    ROS: Per HPI Review of Systems  Constitutional:  Negative for chills, fever and malaise/fatigue.  HENT:  Negative for congestion, ear pain, sinus pain and sore throat.   Eyes:  Negative for blurred vision, discharge and redness.  Respiratory:  Negative for cough, shortness of breath and stridor.   Cardiovascular:  Negative for chest pain, palpitations and leg swelling.  Gastrointestinal:  Negative for abdominal pain, diarrhea, nausea and vomiting.  Musculoskeletal:  Negative for myalgias.  Skin:  Negative for rash.  Neurological:  Negative for dizziness and headaches.  Endo/Heme/Allergies:  Does not bruise/bleed easily.      Current Outpatient Medications:    albuterol  (VENTOLIN  HFA) 108 (90 Base) MCG/ACT inhaler, Inhale 1-2 puffs into the lungs every 4 (four) hours as needed for wheezing or shortness of breath., Disp: 6.7 each, Rfl: 3   cetirizine  (ZYRTEC ) 10 MG tablet, TAKE 1 TABLET BY MOUTH EVERY DAY AS NEEDED FOR ALLERGY, Disp: 90 tablet, Rfl: 1   EPINEPHrine  0.3 mg/0.3 mL IJ SOAJ injection, Inject 0.3 mg into the muscle once as needed (for severe allergic reaction)., Disp: 1 each, Rfl: 1   hydrocortisone  cream 1 %, Apply 1 Application topically 2 (two) times daily. To affected areas, Disp: 20 g, Rfl: 0   levonorgestrel -ethinyl estradiol (VIENVA) 0.1-20 MG-MCG tablet, Take 1 tablet by mouth daily., Disp: 84 tablet, Rfl: 3  Observations/Objective: Today's Vitals   05/22/24 0819  Weight: 120 lb (54.4 kg)  Height: 5' (1.524 m)   Physical Exam Patient appears well, in no distress Weight is baseline  No facial swelling or asymmetry Normal voice-not hoarse and no slurred speech No obvious tremor or mobility impairment Moving neck and UEs normally Able to hear the call well  No cough or shortness of breath during interview  Talkative and mentally sharp with no cognitive changes Rash on chin and above mouth- patches of erythema that appear dry, no pustules,papules or vesicles  Few small acne blemishes on forehead Affect is normal   Assessment and Plan: Dermatitis of face Assessment & Plan: For about 2 weeks  on and off  No change in diet or exposures  Patchy redness around mouth , no pustules, vesicles or skin breakdown Some itching  Unsure what she is reacting to   Recommend  Dove soap for sensitive skin or cetaphil for cleansing  Cetaphil moisturizer (no fragrance) Sent in hydrocortisone  cream 1% to use on affected areas 1-2 times daily -not more than 2 weeks  Update if not starting to improve in a week or if worsening  Follow up for in office exam if not improving  Call back and Er  precautions noted in detail today      Other orders -     Hydrocortisone ; Apply 1 Application topically 2 (two) times daily. To affected areas  Dispense: 20 g; Refill: 0    Follow Up Instructions: No follow-ups on file.  Use dove soap for sensitive skin on face and body   Use the cetaphil moisturizer after you wash your face   Once to twice daily -try the hydrocortisone  cream 1% on the red/ itchy areas  Don't use this for more than 2 weeks   Avoid hot water  Avoid harsh detergents and fragrances for now    Follow up in the office if not improving   I discussed the assessment and treatment plan with the patient. The patient was provided an opportunity to ask questions, and all were answered. The patient agreed with the plan and demonstrated an understanding of the instructions.   The patient was advised to call back or seek an in-person evaluation if the symptoms worsen or if the condition fails to improve as anticipated.  The above assessment and management plan was discussed with the patient. The patient verbalized understanding of and has agreed to the management plan.   Laine Balls, MD

## 2024-05-22 NOTE — Assessment & Plan Note (Signed)
 For about 2 weeks on and off  No change in diet or exposures  Patchy redness around mouth , no pustules, vesicles or skin breakdown Some itching  Unsure what she is reacting to   Recommend  Dove soap for sensitive skin or cetaphil for cleansing  Cetaphil moisturizer (no fragrance) Sent in hydrocortisone  cream 1% to use on affected areas 1-2 times daily -not more than 2 weeks  Update if not starting to improve in a week or if worsening  Follow up for in office exam if not improving  Call back and Er precautions noted in detail today

## 2024-05-22 NOTE — Patient Instructions (Addendum)
 Use dove soap for sensitive skin on face and body   Use the cetaphil moisturizer after you wash your face   Once to twice daily -try the hydrocortisone  cream 1% on the red/ itchy areas  Don't use this for more than 2 weeks   Avoid hot water  Avoid harsh detergents and fragrances for now    Follow up in the office if not improving

## 2024-06-15 ENCOUNTER — Encounter: Payer: Self-pay | Admitting: Radiology

## 2024-06-24 ENCOUNTER — Encounter: Payer: Self-pay | Admitting: Emergency Medicine

## 2024-06-24 ENCOUNTER — Ambulatory Visit
Admission: EM | Admit: 2024-06-24 | Discharge: 2024-06-24 | Disposition: A | Discharge status: Home / Self Care | Attending: Emergency Medicine | Admitting: Emergency Medicine

## 2024-06-24 DIAGNOSIS — R21 Rash and other nonspecific skin eruption: Secondary | ICD-10-CM | POA: Diagnosis not present

## 2024-06-24 MED ORDER — FLUCONAZOLE 150 MG PO TABS: 150.0000 mg | ORAL_TABLET | ORAL | 0 refills | Status: DC | PRN

## 2024-06-24 MED ORDER — CEPHALEXIN 500 MG PO CAPS: 500.0000 mg | ORAL_CAPSULE | Freq: Three times a day (TID) | ORAL | 0 refills | Status: AC

## 2024-06-24 NOTE — ED Triage Notes (Signed)
 Patient reports painful, red, itchy rash under left armpit x 1 week. Patient has not taken anything for symptoms. Rates pain 3/10.

## 2024-06-24 NOTE — ED Provider Notes (Signed)
 Robin Ford    CSN: [id number] Arrival date & time: 06/24/24  1229      History   Chief Complaint Chief Complaint  Patient presents with   Rash    HPI Robin Ford is a 23 y.o. female.   Patient presents for evaluation of erythematous pruritic rash present underneath the left axilla beginning 7 days ago.  First occurrence, denies changes in toiletries diet medicines or recent travel.  No close contact has similar symptoms.  Has not attempted treatment.  Denies presence of drainage, fever.   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 03/27/2024   Visit for routine gyn exam 03/20/2024   Sting from hornet, wasp, or bee 03/20/2024   Ganglion cyst of dorsum of right wrist 09/27/2023   Ganglion cyst of tendon sheath of right hand 09/24/2023   Urticaria 08/13/2023   Episode of dizziness 06/06/2023   Paroxysmal tachycardia (HCC) 06/06/2023   General counseling and advice on female contraception 10/14/2018   Seborrheic dermatitis of scalp 06/02/2018   Routine general medical examination at a health care facility 03/12/2014   Allergic rhinitis     Past Surgical History:  Procedure Laterality Date   GANGLION CYST EXCISION Right 12/25/2023   Procedure: EXCISION, GANGLION CYST, WRIST;  Surgeon: Jerri Kay HERO, MD;  Location: [facility];  Service: Orthopedics;  Laterality: Right;    OB History   No obstetric history on file.      Home Medications    Prior to Admission medications   Medication Sig Start Date End Date Taking? Authorizing Provider  cephALEXin  (KEFLEX ) 500 MG capsule Take 1 capsule (500 mg total) by mouth 3 (three) times daily  for 5 days. 06/24/24 06/29/24 Yes Trino Higinbotham, Shelba SAUNDERS, NP  fluconazole (DIFLUCAN) 150 MG tablet Take 1 tablet (150 mg total) by mouth every three (3) days as needed for up to 2 doses. 06/24/24  Yes Siah Steely, Shelba SAUNDERS, NP  albuterol  (VENTOLIN  HFA) 108 (90 Base) MCG/ACT inhaler Inhale 1-2 puffs into the lungs every 4 (four) hours as needed for wheezing or shortness of breath. 03/20/24   Tower, Laine LABOR, MD  cetirizine  (ZYRTEC ) 10 MG tablet TAKE 1 TABLET BY MOUTH EVERY DAY AS NEEDED FOR ALLERGY 04/21/24   Tower, Laine LABOR, MD  EPINEPHrine  0.3 mg/0.3 mL IJ SOAJ injection Inject 0.3 mg into the muscle once as needed (for severe allergic reaction). 08/13/23   Tower, Laine LABOR, MD  hydrocortisone  cream 1 % Apply 1 Application topically 2 (two) times daily. To affected areas 05/22/24   Tower, Laine LABOR, MD  levonorgestrel -ethinyl estradiol (VIENVA) 0.1-20 MG-MCG tablet Take 1 tablet by mouth daily. 03/20/24   Tower, Laine LABOR, MD    Family History Family History  Problem Relation Age of Onset   Asthma Maternal Grandmother    Cancer Maternal Grandfather    COPD Maternal Grandfather    Stroke Maternal Grandfather    Dementia Other     Social History Social History   Tobacco Use   Smoking status: Never   Smokeless tobacco: Never  Vaping Use   Vaping status: Never Used  Substance Use Topics   Alcohol use: No    Alcohol/week: 0.0 standard drinks of alcohol   Drug use: No     Allergies   Bee venom, Penicillins, and Tessalon  [benzonatate ]   Review of Systems Review of Systems   Physical Exam Triage Vital Signs ED Triage Vitals  Encounter Vitals Group     BP 06/24/24 1250 113/76     Girls Systolic BP Percentile --      Girls Diastolic BP Percentile --      Boys Systolic BP Percentile --      Boys Diastolic BP Percentile --      Pulse Rate 06/24/24 1250 84     Resp 06/24/24 1250 16     Temp 06/24/24 1250 98 F (36.7 C)     Temp Source 06/24/24 1250 Oral     SpO2 06/24/24 1249 98 %     Weight --       Height --      Head Circumference --      Peak Flow --      Pain Score 06/24/24 1249 3     Pain Loc --      Pain Education --      Exclude from Growth Chart --    No data found.  Updated Vital Signs BP 113/76 (BP Location: Right Arm)   Pulse 84   Temp 98 F (36.7 C) (Oral)   Resp 16   LMP 06/17/2024 (Exact Date)   SpO2 98%   Visual Acuity Right Eye Distance:   Left Eye Distance:   Bilateral Distance:    Right Eye Near:   Left Eye Near:    Bilateral Near:     Physical Exam Constitutional:      Appearance: Normal appearance.  Eyes:     Extraocular Movements: Extraocular movements intact.  Pulmonary:     Effort: Pulmonary effort is normal.  Neurological:     Mental Status: She is alert and oriented to person, place, and time. Mental status is at baseline.      UC Treatments / Results  Labs (all labs ordered are listed, but only abnormal results are displayed) Labs Reviewed - No data to display  EKG   Radiology No results found.  Procedures Procedures (including critical care time)  Medications Ordered in UC Medications - No data to display  Initial Impression / Assessment and Plan / UC Course  I have reviewed the triage vital signs and the nursing notes.  Pertinent labs & imaging results that were available during my care of the patient were reviewed by me and considered in my medical decision making (see chart for details).   Rash  Unknown etiology, crusting at the top of rash concerning for bacteria while circular presentation concerning for fungus therefore will provide coverage for both, prescribed cephalexin  and Diflucan, discussed administration recommended daily cleansing and monitoring, may use over-the-counter analgesics as needed for any discomfort and recommend over-the-counter supportive care for pruritus advised to follow-up for any persisting or worsening symptoms Final Clinical Impressions(s) / UC Diagnoses   Final diagnoses:   Rash     Discharge Instructions        Today you are evaluated for the rash on your arm which provides concern for both bacteria and fungus  Crusty appearance around the rim is concerning for bacteria while the circular presentation is concerning for fungus and therefore we will treat for both  Take cephalexin  every 8 hours for 5 days for treatment  of bacteria  Take 1 Diflucan tablet today and then in 3 days take second dose for treatment of fungus  Clean over the affected area with unscented soap and water, pat and do not rub with unscented soap and water, pat and do not rub, may leave open to air  May take Tylenol  and or Motrin  as needed for pain  For itching if severe may use allergy medicines such as Claritin or Zyrtec , may also apply topical anti-itch medicine such as Benadryl  cream or calamine lotion  If you have any further concerns please follow-up with urgent care for reevaluation as needed   ED Prescriptions     Medication Sig Dispense Auth. Provider   cephALEXin  (KEFLEX ) 500 MG capsule Take 1 capsule (500 mg total) by mouth 3 (three) times daily for 5 days. 15 capsule Vanshika Jastrzebski R, NP   fluconazole (DIFLUCAN) 150 MG tablet Take 1 tablet (150 mg total) by mouth every three (3) days as needed for up to 2 doses. 2 tablet Samara Stankowski R, NP      PDMP not reviewed this encounter.   Teresa Shelba SAUNDERS, NP 06/24/24 1311

## 2024-06-24 NOTE — Discharge Instructions (Addendum)
   Today you are evaluated for the rash on your arm which provides concern for both bacteria and fungus  Crusty appearance around the rim is concerning for bacteria while the circular presentation is concerning for fungus and therefore we will treat for both  Take cephalexin  every 8 hours for 5 days for treatment of bacteria  Take 1 Diflucan tablet today and then in 3 days take second dose for treatment of fungus  Clean over the affected area with unscented soap and water, pat and do not rub with unscented soap and water, pat and do not rub, may leave open to air  May take Tylenol  and or Motrin  as needed for pain  For itching if severe may use allergy medicines such as Claritin or Zyrtec , may also apply topical anti-itch medicine such as Benadryl  cream or calamine lotion  If you have any further concerns please follow-up with urgent care for reevaluation as needed

## 2024-07-27 ENCOUNTER — Ambulatory Visit (INDEPENDENT_AMBULATORY_CARE_PROVIDER_SITE_OTHER)
Admission: RE | Admit: 2024-07-27 | Discharge: 2024-07-27 | Disposition: A | Source: Ambulatory Visit | Attending: Family Medicine | Admitting: Family Medicine

## 2024-07-27 ENCOUNTER — Ambulatory Visit: Payer: Self-pay | Admitting: Family Medicine

## 2024-07-27 ENCOUNTER — Ambulatory Visit: Admitting: Family Medicine

## 2024-07-27 VITALS — BP 110/62 | HR 79 | Temp 98.3°F | Ht 60.0 in | Wt 120.4 lb

## 2024-07-27 DIAGNOSIS — M546 Pain in thoracic spine: Secondary | ICD-10-CM

## 2024-07-27 NOTE — Assessment & Plan Note (Addendum)
 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

## 2024-07-27 NOTE — Progress Notes (Signed)
 Subjective:    Patient ID: Robin Ford, female    DOB: 2000-10-20, 23 y.o.   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)   No n/t No loss of strength   Over the counter  Tylenol   Heating pad      TS xray 2015  DG Thoracic Spine 2 View (Accession [id number]) (Order [id number]) Imaging Date: 11/04/2013 Department: [facility] [address] Released By: Hope Veva PARAS Authorizing: Laramie Meissner, Laine LABOR, MD   Exam Status  Status  Final [99]   PACS Intelerad Image Link   Show images for DG Thoracic Spine 2 View Study Result  Narrative  CLINICAL DATA:  Thoracic and lumbar spine pain ; patient is a cheerleader  EXAM: THORACIC SPINE - 2 VIEW  COMPARISON:  DG THORACIC SPINE dated 12/27/2011  FINDINGS: The thoracic vertebral bodies are preserved in height. There is a transitional vertebral segment at the thoracolumbar junction. Twelve pairs of normal-appearing ribs are demonstrated. The pedicles appear intact. There are no abnormal paravertebral soft tissue densities. The intervertebral disc space heights are well maintained.  IMPRESSION: There is no acute or significant chronic bony abnormality of the thoracic spine.    Imaging today DG Thoracic Spine 2 View Result Date: 07/27/2024 EXAM: 2 VIEW(S) XRAY OF THE  THORACIC SPINE 07/27/2024 01:01:22 PM COMPARISON: None available. CLINICAL HISTORY: Midline mid thoracic pain since trying to lift an adult. Some bony lower TS tenderness on exam. FINDINGS: BONES: Vertebral body heights are maintained. Alignment is normal. DISCS AND DEGENERATIVE CHANGES: No severe degenerative changes. SOFT TISSUES: The visualized lungs are clear. IMPRESSION: 1. No acute osseous abnormality of the thoracic spine. 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 03/27/2024   Visit for routine gyn exam 03/20/2024   Sting from hornet, wasp, or bee 03/20/2024   Ganglion cyst of dorsum of right wrist 09/27/2023   Ganglion cyst of tendon sheath of right hand 09/24/2023   Urticaria 08/13/2023   Episode of dizziness 06/06/2023   Paroxysmal tachycardia (HCC) 06/06/2023   General counseling and advice on female contraception 10/14/2018   Seborrheic dermatitis of scalp 06/02/2018   Routine general medical examination at a health care facility 03/12/2014   Allergic rhinitis    Past Medical History:  Diagnosis Date   Abnormal EKG    tachycardia   Allergic rhinitis    Asthma  Sore throat    Tachycardia 07/19/2022   90s-100s   Past Surgical History:  Procedure Laterality Date   GANGLION CYST EXCISION Right 12/25/2023   Procedure: EXCISION, GANGLION CYST, WRIST;  Surgeon: Jerri Kay HERO, MD;  Location: [facility];  Service: Orthopedics;  Laterality: Right;   Social History[1] Family History  Problem Relation Age of Onset   Asthma Maternal Grandmother    Cancer Maternal Grandfather    COPD Maternal Grandfather    Stroke Maternal Grandfather    Dementia Other    Allergies[2] Medications  Ordered Prior to Encounter[3]  Review of Systems  Constitutional:  Negative for activity change, appetite change, fatigue, fever and unexpected weight change.  HENT:  Negative for congestion, ear pain, rhinorrhea, sinus pressure and sore throat.   Eyes:  Negative for pain, redness and visual disturbance.  Respiratory:  Negative for cough, shortness of breath and wheezing.   Cardiovascular:  Negative for chest pain and palpitations.  Gastrointestinal:  Negative for abdominal pain, blood in stool, constipation and diarrhea.  Endocrine: Negative for polydipsia and polyuria.  Genitourinary:  Negative for dysuria, frequency and urgency.  Musculoskeletal:  Positive for back pain. Negative for arthralgias and myalgias.  Skin:  Negative for pallor and rash.  Allergic/Immunologic: Negative for environmental allergies.  Neurological:  Negative for dizziness, syncope and headaches.  Hematological:  Negative for adenopathy. Does not bruise/bleed easily.  Psychiatric/Behavioral:  Negative for decreased concentration and dysphoric mood. The patient is not nervous/anxious.        Objective:   Physical Exam Constitutional:      General: She is not in acute distress.    Appearance: Normal appearance. She is normal weight. She is not ill-appearing.  HENT:     Mouth/Throat:     Mouth: Mucous membranes are moist.  Eyes:     Conjunctiva/sclera: Conjunctivae normal.     Pupils: Pupils are equal, round, and reactive to light.  Neck:     Vascular: No carotid bruit.  Cardiovascular:     Rate and Rhythm: Normal rate and regular rhythm.  Pulmonary:     Effort: Pulmonary effort is normal. No respiratory distress.     Breath sounds: Normal breath sounds. No stridor. No wheezing, rhonchi or rales.  Abdominal:     General: There is no distension.  Musculoskeletal:     Cervical back: Neck supple. No rigidity or tenderness.     Thoracic back: Tenderness and bony tenderness present. No swelling, deformity,  signs of trauma or spasms. Decreased range of motion. No scoliosis.     Comments: Tender over lower TS spinous processes  Some muscle tightness/tenderness bilateral   Pain is worse with full flextion and left lateral bend  No neuro changes    Lymphadenopathy:     Cervical: No cervical adenopathy.  Skin:    Findings: No erythema or rash.  Neurological:     Mental Status: She is alert.     Sensory: No sensory deficit.     Motor: No weakness.     Gait: Gait normal.  Psychiatric:        Mood and Affect: Mood normal.           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  Pain is midline with bony tenderness  Xray ordered - no fracture or other abn noted  Encouraged ice/heat if helpful  Can add nsaid prn  Activity as tolerated  Consider physical therapy      Relevant Orders   DG Thoracic Spine 2 View (Completed)      [1]  Social History Tobacco Use   Smoking status: Never   Smokeless tobacco: Never  Vaping Use   Vaping status: Never Used  Substance Use Topics   Alcohol use: No    Alcohol/week: 0.0 standard drinks of alcohol   Drug use: No  [2]  Allergies Allergen Reactions   Bee Venom Anaphylaxis   Penicillins Hives and Other (See Comments)    Has patient had a PCN reaction causing immediate rash, facial/tongue/throat swelling, SOB or lightheadedness with hypotension: No Has patient had a PCN reaction causing severe rash involving mucus membranes or skin necrosis: No Has patient had a PCN reaction that required hospitalization No Has patient had a PCN reaction occurring within the last 10 years: Yes If all of the above answers are NO, then may proceed with Cephalosporin use.   Tessalon  [Benzonatate ]     rash  [3]  Current Outpatient Medications on File  Prior to Visit  Medication Sig Dispense Refill   albuterol  (VENTOLIN  HFA) 108 (90 Base) MCG/ACT inhaler Inhale 1-2 puffs into the lungs every 4 (four) hours as needed for wheezing or shortness of breath. 6.7 each 3   cetirizine  (ZYRTEC ) 10 MG tablet TAKE 1 TABLET BY MOUTH EVERY DAY AS NEEDED FOR ALLERGY 90 tablet 1   EPINEPHrine  0.3 mg/0.3 mL IJ SOAJ injection Inject 0.3 mg into the muscle once as needed (for severe allergic reaction). 1 each 1   hydrocortisone  cream 1 % Apply 1 Application topically 2 (two) times daily. To affected areas 20 g 0   levonorgestrel -ethinyl estradiol (VIENVA) 0.1-20 MG-MCG tablet Take 1 tablet by mouth daily. 84 tablet 3   No current facility-administered medications on file prior to visit.

## 2024-07-27 NOTE — Patient Instructions (Signed)
 Xray of back now  We will reach out with results   Continue heat 10 minutes if helpful  Try ice also   Tylenol  is ok  You can also try advil  2-4 pills with full stomach up to every 8 hours as needed    Some stretching is ok if it does not hurt

## 2024-07-30 ENCOUNTER — Telehealth: Payer: Self-pay | Admitting: *Deleted

## 2024-07-30 NOTE — Telephone Encounter (Signed)
 I called pt regarding xray results from last appt., and she said she had just talked to someone about us  not having her correct insurance for her appt on 07/27/24 and wanted to make sure that we file her claim under the correct insurance. She isn't sure who she talked to but wanted our office to f/u with her regarding this issue. I advise pt I would send message to front office/manager to f/u with her since I don't handle this. Thanks

## 2024-08-22 ENCOUNTER — Ambulatory Visit: Payer: Self-pay
